# Patient Record
Sex: Female | Born: 1990 | Race: Black or African American | Hispanic: No | Marital: Married | Smoking: Former smoker
Health system: Southern US, Community
[De-identification: ages and names within clinical notes are randomized; demographics above are authoritative.]

## PROBLEM LIST (undated history)

## (undated) DIAGNOSIS — Z803 Family history of malignant neoplasm of breast: Secondary | ICD-10-CM

## (undated) DIAGNOSIS — Z800 Family history of malignant neoplasm of digestive organs: Secondary | ICD-10-CM

## (undated) DIAGNOSIS — I499 Cardiac arrhythmia, unspecified: Secondary | ICD-10-CM

## (undated) DIAGNOSIS — Z8481 Family history of carrier of genetic disease: Secondary | ICD-10-CM

## (undated) DIAGNOSIS — R51 Headache: Secondary | ICD-10-CM

## (undated) DIAGNOSIS — R220 Localized swelling, mass and lump, head: Secondary | ICD-10-CM

## (undated) DIAGNOSIS — E785 Hyperlipidemia, unspecified: Secondary | ICD-10-CM

## (undated) DIAGNOSIS — R519 Headache, unspecified: Secondary | ICD-10-CM

## (undated) DIAGNOSIS — IMO0002 Reserved for concepts with insufficient information to code with codable children: Secondary | ICD-10-CM

## (undated) DIAGNOSIS — Z8042 Family history of malignant neoplasm of prostate: Secondary | ICD-10-CM

## (undated) DIAGNOSIS — H9190 Unspecified hearing loss, unspecified ear: Secondary | ICD-10-CM

## (undated) DIAGNOSIS — F419 Anxiety disorder, unspecified: Secondary | ICD-10-CM

## (undated) DIAGNOSIS — K219 Gastro-esophageal reflux disease without esophagitis: Secondary | ICD-10-CM

## (undated) DIAGNOSIS — M1990 Unspecified osteoarthritis, unspecified site: Secondary | ICD-10-CM

## (undated) DIAGNOSIS — H04129 Dry eye syndrome of unspecified lacrimal gland: Secondary | ICD-10-CM

## (undated) DIAGNOSIS — E559 Vitamin D deficiency, unspecified: Secondary | ICD-10-CM

## (undated) DIAGNOSIS — F988 Other specified behavioral and emotional disorders with onset usually occurring in childhood and adolescence: Secondary | ICD-10-CM

## (undated) HISTORY — DX: Family history of malignant neoplasm of prostate: Z80.42

## (undated) HISTORY — PX: TENDON REPAIR: SHX5111

## (undated) HISTORY — DX: Family history of malignant neoplasm of breast: Z80.3

## (undated) HISTORY — DX: Vitamin D deficiency, unspecified: E55.9

## (undated) HISTORY — DX: Other specified behavioral and emotional disorders with onset usually occurring in childhood and adolescence: F98.8

## (undated) HISTORY — DX: Family history of carrier of genetic disease: Z84.81

## (undated) HISTORY — DX: Hyperlipidemia, unspecified: E78.5

## (undated) HISTORY — PX: ADENOIDECTOMY: SUR15

## (undated) HISTORY — DX: Family history of malignant neoplasm of digestive organs: Z80.0

---
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
## 2010-01-16 ENCOUNTER — Emergency Department (HOSPITAL_COMMUNITY): Admission: EM | Admit: 2010-01-16 | Discharge: 2010-01-16 | Payer: Self-pay | Admitting: Emergency Medicine

## 2010-04-20 ENCOUNTER — Emergency Department (HOSPITAL_COMMUNITY): Admission: EM | Admit: 2010-04-20 | Discharge: 2010-04-20 | Payer: Self-pay | Admitting: Emergency Medicine

## 2010-05-07 ENCOUNTER — Ambulatory Visit: Payer: Self-pay | Admitting: Obstetrics and Gynecology

## 2010-06-16 ENCOUNTER — Ambulatory Visit (HOSPITAL_COMMUNITY): Admission: RE | Admit: 2010-06-16 | Discharge: 2010-06-16 | Payer: Self-pay | Admitting: Family Medicine

## 2010-06-30 LAB — HEPATITIS B SURFACE ANTIGEN: Hepatitis B Surface Ag: NEGATIVE

## 2010-06-30 LAB — RPR: RPR: NONREACTIVE

## 2010-07-08 ENCOUNTER — Other Ambulatory Visit
Admission: RE | Admit: 2010-07-08 | Discharge: 2010-07-08 | Payer: Self-pay | Source: Home / Self Care | Admitting: Obstetrics and Gynecology

## 2010-10-02 LAB — CBC
HCT: 38.3 % (ref 36.0–46.0)
MCV: 99.9 fL (ref 78.0–100.0)
RBC: 3.84 MIL/uL — ABNORMAL LOW (ref 3.87–5.11)
WBC: 4.4 10*3/uL (ref 4.0–10.5)

## 2010-10-02 LAB — DIFFERENTIAL
Eosinophils Relative: 1 % (ref 0–5)
Lymphocytes Relative: 26 % (ref 12–46)
Lymphs Abs: 1.2 10*3/uL (ref 0.7–4.0)
Monocytes Absolute: 0.2 10*3/uL (ref 0.1–1.0)

## 2010-10-02 LAB — BASIC METABOLIC PANEL
BUN: 11 mg/dL (ref 6–23)
CO2: 27 mEq/L (ref 19–32)
Chloride: 107 mEq/L (ref 96–112)
Potassium: 4.2 mEq/L (ref 3.5–5.1)

## 2010-10-23 LAB — STREP A DNA PROBE: Group A Strep Probe: NEGATIVE

## 2010-10-23 LAB — POCT RAPID STREP A (OFFICE): Streptococcus, Group A Screen (Direct): NEGATIVE

## 2010-10-25 LAB — POCT URINALYSIS DIP (DEVICE)
Protein, ur: 30 mg/dL — AB
Urobilinogen, UA: 1 mg/dL (ref 0.0–1.0)

## 2010-10-25 LAB — POCT PREGNANCY, URINE: Preg Test, Ur: NEGATIVE

## 2010-10-26 LAB — POCT URINALYSIS DIP (DEVICE)
Glucose, UA: NEGATIVE mg/dL
Hgb urine dipstick: NEGATIVE
Protein, ur: NEGATIVE mg/dL
Specific Gravity, Urine: 1.025 (ref 1.005–1.030)
Urobilinogen, UA: 1 mg/dL (ref 0.0–1.0)

## 2010-10-26 LAB — GC/CHLAMYDIA PROBE AMP, GENITAL
Chlamydia, DNA Probe: POSITIVE — AB
GC Probe Amp, Genital: NEGATIVE

## 2010-10-26 LAB — WET PREP, GENITAL

## 2010-10-29 LAB — POCT URINALYSIS DIP (DEVICE)
Bilirubin Urine: NEGATIVE
Nitrite: NEGATIVE
Urobilinogen, UA: 1 mg/dL (ref 0.0–1.0)
pH: 6.5 (ref 5.0–8.0)

## 2010-10-29 LAB — WET PREP, GENITAL: Yeast Wet Prep HPF POC: NONE SEEN

## 2010-11-11 ENCOUNTER — Inpatient Hospital Stay (HOSPITAL_COMMUNITY)
Admission: AD | Admit: 2010-11-11 | Discharge: 2010-11-11 | Disposition: A | Discharge status: Home / Self Care | Payer: Medicaid Other | Source: Ambulatory Visit | Attending: Obstetrics and Gynecology | Admitting: Obstetrics and Gynecology

## 2010-11-11 DIAGNOSIS — N390 Urinary tract infection, site not specified: Secondary | ICD-10-CM | POA: Insufficient documentation

## 2010-11-11 DIAGNOSIS — O4700 False labor before 37 completed weeks of gestation, unspecified trimester: Secondary | ICD-10-CM | POA: Insufficient documentation

## 2010-11-11 DIAGNOSIS — O2390 Unspecified genitourinary tract infection in pregnancy, unspecified trimester: Secondary | ICD-10-CM

## 2010-11-11 LAB — URINE MICROSCOPIC-ADD ON

## 2010-11-11 LAB — URINALYSIS, ROUTINE W REFLEX MICROSCOPIC
Glucose, UA: NEGATIVE mg/dL
Specific Gravity, Urine: 1.015 (ref 1.005–1.030)
pH: 6 (ref 5.0–8.0)

## 2010-11-11 LAB — WET PREP, GENITAL: Clue Cells Wet Prep HPF POC: NONE SEEN

## 2010-11-13 LAB — URINE CULTURE
Colony Count: 80000
Culture  Setup Time: 201204242046

## 2011-01-30 ENCOUNTER — Encounter (HOSPITAL_COMMUNITY): Payer: Self-pay | Admitting: *Deleted

## 2011-01-30 ENCOUNTER — Inpatient Hospital Stay (HOSPITAL_COMMUNITY)
Admission: AD | Admit: 2011-01-30 | Discharge: 2011-01-30 | Disposition: A | Discharge status: Home / Self Care | Payer: Medicaid Other | Source: Ambulatory Visit | Attending: Obstetrics and Gynecology | Admitting: Obstetrics and Gynecology

## 2011-01-30 DIAGNOSIS — O479 False labor, unspecified: Secondary | ICD-10-CM | POA: Insufficient documentation

## 2011-01-30 NOTE — Progress Notes (Signed)
uc's started @ 1800 last night, spaced out during the night, now more frequent, denies bleeding or LOF.

## 2011-01-30 NOTE — Progress Notes (Signed)
?   In labor, contractions started last night, have gotten stronger.  Have been 3-10 min apart, spaced out during the night

## 2011-02-06 ENCOUNTER — Encounter (HOSPITAL_COMMUNITY): Payer: Self-pay | Admitting: *Deleted

## 2011-02-06 ENCOUNTER — Inpatient Hospital Stay (HOSPITAL_COMMUNITY): Payer: BC Managed Care – PPO

## 2011-02-06 ENCOUNTER — Inpatient Hospital Stay (HOSPITAL_COMMUNITY)
Admission: AD | Admit: 2011-02-06 | Discharge: 2011-02-10 | DRG: 370 | Disposition: A | Discharge status: Home / Self Care | Payer: BC Managed Care – PPO | Source: Ambulatory Visit | Attending: Obstetrics and Gynecology | Admitting: Obstetrics and Gynecology

## 2011-02-06 DIAGNOSIS — O339 Maternal care for disproportion, unspecified: Secondary | ICD-10-CM | POA: Diagnosis not present

## 2011-02-06 DIAGNOSIS — D696 Thrombocytopenia, unspecified: Secondary | ICD-10-CM | POA: Diagnosis present

## 2011-02-06 DIAGNOSIS — O480 Post-term pregnancy: Secondary | ICD-10-CM | POA: Diagnosis present

## 2011-02-06 DIAGNOSIS — O368190 Decreased fetal movements, unspecified trimester, not applicable or unspecified: Secondary | ICD-10-CM

## 2011-02-06 DIAGNOSIS — O9912 Other diseases of the blood and blood-forming organs and certain disorders involving the immune mechanism complicating childbirth: Secondary | ICD-10-CM | POA: Diagnosis present

## 2011-02-06 DIAGNOSIS — O330 Maternal care for disproportion due to deformity of maternal pelvic bones: Secondary | ICD-10-CM | POA: Diagnosis present

## 2011-02-06 DIAGNOSIS — D689 Coagulation defect, unspecified: Secondary | ICD-10-CM | POA: Diagnosis present

## 2011-02-06 HISTORY — DX: Reserved for concepts with insufficient information to code with codable children: IMO0002

## 2011-02-06 LAB — CBC
HCT: 38.4 % (ref 36.0–46.0)
Hemoglobin: 12.9 g/dL (ref 12.0–15.0)
MCV: 99.2 fL (ref 78.0–100.0)
WBC: 7.7 10*3/uL (ref 4.0–10.5)

## 2011-02-06 LAB — URINALYSIS, DIPSTICK ONLY
Bilirubin Urine: NEGATIVE
Glucose, UA: NEGATIVE mg/dL
Hgb urine dipstick: NEGATIVE
Ketones, ur: 15 mg/dL — AB
Specific Gravity, Urine: 1.02 (ref 1.005–1.030)
pH: 6 (ref 5.0–8.0)

## 2011-02-06 MED ORDER — LACTATED RINGERS IV SOLN
INTRAVENOUS | Status: DC
  Administered 2011-02-06 – 2011-02-07 (×4): via INTRAVENOUS

## 2011-02-06 MED ORDER — FLEET ENEMA 7-19 GM/118ML RE ENEM: 1.0000 | ENEMA | RECTAL | Status: DC | PRN

## 2011-02-06 MED ORDER — LIDOCAINE HCL (PF) 1 % IJ SOLN
30.0000 mL | INTRAMUSCULAR | Status: DC | PRN
  Filled 2011-02-06: qty 30

## 2011-02-06 MED ORDER — OXYCODONE-ACETAMINOPHEN 5-325 MG PO TABS: 2.0000 | ORAL_TABLET | ORAL | Status: DC | PRN

## 2011-02-06 MED ORDER — ONDANSETRON HCL 4 MG/2ML IJ SOLN: 4.0000 mg | Freq: Four times a day (QID) | INTRAMUSCULAR | Status: DC | PRN

## 2011-02-06 MED ORDER — NALBUPHINE SYRINGE 5 MG/0.5 ML
5.0000 mg | INJECTION | INTRAMUSCULAR | Status: DC | PRN
  Administered 2011-02-07 (×2): 5 mg via INTRAVENOUS
  Filled 2011-02-06 (×3): qty 0.5

## 2011-02-06 MED ORDER — LACTATED RINGERS IV SOLN: 500.0000 mL | INTRAVENOUS | Status: DC | PRN

## 2011-02-06 MED ORDER — IBUPROFEN 600 MG PO TABS: 600.0000 mg | ORAL_TABLET | Freq: Four times a day (QID) | ORAL | Status: DC | PRN

## 2011-02-06 MED ORDER — TERBUTALINE SULFATE 1 MG/ML IJ SOLN: 0.2500 mg | Freq: Once | INTRAMUSCULAR | Status: AC | PRN

## 2011-02-06 MED ORDER — CITRIC ACID-SODIUM CITRATE 334-500 MG/5ML PO SOLN
30.0000 mL | ORAL | Status: DC | PRN
  Administered 2011-02-07: 30 mL via ORAL
  Filled 2011-02-06: qty 30

## 2011-02-06 MED ORDER — OXYTOCIN 20 UNITS IN LACTATED RINGERS INFUSION - SIMPLE: 125.0000 mL/h | Freq: Once | INTRAVENOUS | Status: DC

## 2011-02-06 MED ORDER — ACETAMINOPHEN 325 MG PO TABS: 650.0000 mg | ORAL_TABLET | ORAL | Status: DC | PRN

## 2011-02-06 MED ORDER — PENICILLIN G POTASSIUM 5000000 UNITS IJ SOLR
5.0000 10*6.[IU] | Freq: Once | INTRAVENOUS | Status: AC | PRN
  Filled 2011-02-06: qty 5

## 2011-02-06 MED ORDER — DINOPROSTONE 10 MG VA INST
10.0000 mg | VAGINAL_INSERT | Freq: Once | VAGINAL | Status: AC
  Administered 2011-02-06: 10 mg via VAGINAL
  Filled 2011-02-06: qty 1

## 2011-02-06 MED ORDER — PENICILLIN G POTASSIUM 5000000 UNITS IJ SOLR
2.5000 10*6.[IU] | INTRAMUSCULAR | Status: DC | PRN
  Filled 2011-02-06: qty 2.5

## 2011-02-06 NOTE — ED Notes (Signed)
[facility] DC'd, pt to U/S. 

## 2011-02-06 NOTE — Progress Notes (Signed)
Pt states she has not felt the baby moving as much as usual. Called the office and sent to MAU. Has had one contractions while in the lobby. Has had some thick mucus d/c.

## 2011-02-06 NOTE — ED Notes (Signed)
Dr. Richardson Dopp coming to see pt, BPP 6/8, pt informed MD coming to see her.

## 2011-02-06 NOTE — ED Notes (Signed)
Dr. Richardson Dopp in to discuss plan of care with pt.

## 2011-02-06 NOTE — Progress Notes (Signed)
Pt states she has had decreased fetal movement all day today, some uc's, denies pain, no bleeding or LOF.  Some mucus discharge.

## 2011-02-06 NOTE — ED Provider Notes (Addendum)
History     Chief Complaint  Patient presents with  . Decreased Fetal Movement   The history is provided by the patient.   Patient of Dr. Dawayne Patricia presents with report of decreased fetal movement that began this am.  She called and spoke to Dr. Richardson Dopp and was instructed to come here for evaluation. G3 P0020.  She was due 7/16.  Denies complications with this pregnancy.    Thinks she lost her mucous plug several days ago and now just a little left.  Denies loss of fluid.  Saw Dr. Richardson Dopp 4 days ago and reports she was 1.5cm dilated.   Now having contractions 2-8 minutes apart, irregular.   Past Medical History  Diagnosis Date  . Asthma     Past Surgical History  Procedure Date  . Tonsillectomy and adenoidectomy 2010  . Adenoidectomy     No family history on file.  History  Substance Use Topics  . Smoking status: Never Smoker   . Smokeless tobacco: Not on file  . Alcohol Use: No    Allergies:  Allergies  Allergen Reactions  . Sulfa Antibiotics Hives    Prescriptions prior to admission  Medication Sig Dispense Refill  . albuterol (PROVENTIL HFA;VENTOLIN HFA) 108 (90 BASE) MCG/ACT inhaler Inhale 2 puffs into the lungs daily as needed. For asthma       . prenatal vitamin w/FE, FA (PRENATAL 1 + 1) 27-1 MG TABS Take 1 tablet by mouth daily.          ROS Physical Exam   Blood pressure 116/68, pulse 93, temperature 98.7 F (37.1 C), temperature source Oral, resp. rate 16, height 5' 4.5" (1.638 m), weight 203 lb (92.08 kg), SpO2 99.00%.  Physical Exam  MAU Course  Procedures  U/S completed MDM Consulted with Dr. Richardson Dopp.  Pt's FMS-reactive, Baseline fetal heart rate 130-135.  She is contracting q3-10 minutes.  No decels.  Cervical exam by Darel Hong, RN 1-2 Cm dilated,  70% Effaced   Darel Hong, Rn states patient reported only 5 movements since here.  Even though strip is reactive, I called Dr. Richardson Dopp to see if we could get BPP here.  She agreed, if 8/8 can be discharged home.   Dr. Richardson Dopp  called to check on patient, called her to give BPP results of 6/8.  Dr. Richardson Dopp plans to admit patient, she is coming over to put in the orders. Assessment and Plan   Decreased Fetal Movement BPP 6/8 Admit  KEY,EVE M 02/06/2011, 1:51 PM

## 2011-02-06 NOTE — H&P (Signed)
Amanda Patrick is a 20 y.o. female  At 40 wks and 4 days ega based on fist trimester u/s with EDD 02/02/2011. She presented to MAU c/o of decreased fetal movement. BPP was 6/8. She reports contractions q 2-10 min/ no vaginal bleeding. No LOF.   OB History    Grav Para Term Preterm Abortions TAB SAB Ect Mult Living   3    2 2          Past Medical History  Diagnosis Date  . Asthma     P GYN hx: chlamydia and gonorrhea treated in 2010.. H/o HPV  POB hx  EAB  X 2   Past Surgical History  Procedure Date  . Tonsillectomy and adenoidectomy 2010  . Adenoidectomy    Allergies : Sulfa / Nasonex  Family History: family history is not on file. Social History:  reports that she has never smoked. She does not have any smokeless tobacco history on file. She reports that she does not drink alcohol or use illicit drugs.   Ros negative except as stated in hpi   VS: AF VSS General A&O NAD CV RRR Lungs Clear Abd Gravid Ext trace edema bilaterally   Dilation: 1.5 Effacement (%): 70 Station: -1 Exam by:: Dorrene German RN Blood pressure 116/68, pulse 93, temperature 98.7 F (37.1 C), temperature source Oral, resp. rate 16, height 5' 4.5" (1.638 m), weight 92.08 kg (203 lb), SpO2 99.00%.  Prenatal labs: ABO, Rh:   Antibody: Negative (12/12 0000) Rubella:   RPR: Nonreactive (12/12 0000)  HBsAg: Negative (12/12 0000)  HIV: Non-reactive (12/12 0000)  GBS: Positive (06/19 0000)   Assessment/Plan: 40 4/7 wks post dates with BPP 6/8 will admit for induction Cervidil  For induction  Plan for penicillin for gbs prophylaxis in active labor or with ROM Dr. Liberty Handy covering [phone])    Amanda Patrick. 02/06/2011, 6:17 PM

## 2011-02-07 ENCOUNTER — Encounter (HOSPITAL_COMMUNITY)
Admission: AD | Disposition: A | Discharge status: Home / Self Care | Payer: Self-pay | Source: Ambulatory Visit | Attending: Obstetrics and Gynecology

## 2011-02-07 ENCOUNTER — Encounter (HOSPITAL_COMMUNITY): Payer: Self-pay | Admitting: Anesthesiology

## 2011-02-07 ENCOUNTER — Encounter (HOSPITAL_COMMUNITY): Payer: Self-pay | Admitting: *Deleted

## 2011-02-07 ENCOUNTER — Inpatient Hospital Stay (HOSPITAL_COMMUNITY): Payer: BC Managed Care – PPO | Admitting: Anesthesiology

## 2011-02-07 DIAGNOSIS — O99119 Other diseases of the blood and blood-forming organs and certain disorders involving the immune mechanism complicating pregnancy, unspecified trimester: Secondary | ICD-10-CM | POA: Diagnosis present

## 2011-02-07 DIAGNOSIS — D696 Thrombocytopenia, unspecified: Secondary | ICD-10-CM | POA: Diagnosis present

## 2011-02-07 DIAGNOSIS — O339 Maternal care for disproportion, unspecified: Secondary | ICD-10-CM | POA: Diagnosis not present

## 2011-02-07 LAB — URIC ACID: Uric Acid, Serum: 4.3 mg/dL (ref 2.4–7.0)

## 2011-02-07 LAB — COMPREHENSIVE METABOLIC PANEL
AST: 16 U/L (ref 0–37)
Albumin: 3.1 g/dL — ABNORMAL LOW (ref 3.5–5.2)
BUN: 7 mg/dL (ref 6–23)
Calcium: 9.6 mg/dL (ref 8.4–10.5)
Creatinine, Ser: 0.63 mg/dL (ref 0.50–1.10)

## 2011-02-07 LAB — CBC
HCT: 38.8 % (ref 36.0–46.0)
Hemoglobin: 13.7 g/dL (ref 12.0–15.0)
MCH: 33.9 pg (ref 26.0–34.0)
MCHC: 34.6 g/dL (ref 30.0–36.0)
MCV: 98 fL (ref 78.0–100.0)
Platelets: 116 10*3/uL — ABNORMAL LOW (ref 150–400)
RDW: 12.9 % (ref 11.5–15.5)
RDW: 12.8 % (ref 11.5–15.5)
WBC: 11.7 10*3/uL — ABNORMAL HIGH (ref 4.0–10.5)

## 2011-02-07 LAB — LACTATE DEHYDROGENASE: LDH: 152 U/L (ref 94–250)

## 2011-02-07 LAB — ABO/RH: ABO/RH(D): O POS

## 2011-02-07 SURGERY — Surgical Case
Anesthesia: Regional | Site: Abdomen | Wound class: Clean Contaminated

## 2011-02-07 MED ORDER — FENTANYL CITRATE 0.05 MG/ML IJ SOLN
INTRAMUSCULAR | Status: AC
  Filled 2011-02-07: qty 2

## 2011-02-07 MED ORDER — HYDROMORPHONE HCL 1 MG/ML IJ SOLN: 0.2500 mg | INTRAMUSCULAR | Status: DC | PRN

## 2011-02-07 MED ORDER — SODIUM CHLORIDE 0.9 % IV SOLN
1.0000 ug/kg/h | INTRAVENOUS | Status: DC | PRN
  Filled 2011-02-07: qty 2.5

## 2011-02-07 MED ORDER — DIPHENHYDRAMINE HCL 50 MG/ML IJ SOLN: 12.5000 mg | INTRAMUSCULAR | Status: DC | PRN

## 2011-02-07 MED ORDER — ONDANSETRON HCL 4 MG/2ML IJ SOLN: 4.0000 mg | Freq: Once | INTRAMUSCULAR | Status: DC | PRN

## 2011-02-07 MED ORDER — EPHEDRINE 5 MG/ML INJ
10.0000 mg | INTRAVENOUS | Status: DC | PRN
  Filled 2011-02-07: qty 4

## 2011-02-07 MED ORDER — OXYTOCIN 20 UNITS IN LACTATED RINGERS INFUSION - SIMPLE
INTRAVENOUS | Status: DC | PRN
  Administered 2011-02-07 (×2): 20 [IU] via INTRAVENOUS

## 2011-02-07 MED ORDER — ONDANSETRON HCL 4 MG/2ML IJ SOLN
INTRAMUSCULAR | Status: DC | PRN
  Administered 2011-02-07: 4 mg via INTRAVENOUS

## 2011-02-07 MED ORDER — SODIUM CHLORIDE 0.9 % IJ SOLN: 3.0000 mL | INTRAMUSCULAR | Status: DC | PRN

## 2011-02-07 MED ORDER — DIPHENHYDRAMINE HCL 25 MG PO CAPS: 25.0000 mg | ORAL_CAPSULE | ORAL | Status: DC | PRN

## 2011-02-07 MED ORDER — FENTANYL 2.5 MCG/ML BUPIVACAINE 1/10 % EPIDURAL INFUSION (WH - ANES): 14.0000 mL/h | INTRAMUSCULAR | Status: DC

## 2011-02-07 MED ORDER — EPHEDRINE 5 MG/ML INJ
INTRAVENOUS | Status: AC
  Filled 2011-02-07: qty 10

## 2011-02-07 MED ORDER — TERBUTALINE SULFATE 1 MG/ML IJ SOLN
0.2500 mg | Freq: Once | INTRAMUSCULAR | Status: AC | PRN
  Administered 2011-02-07: 0.25 mg via SUBCUTANEOUS
  Filled 2011-02-07: qty 1

## 2011-02-07 MED ORDER — MORPHINE SULFATE 0.5 MG/ML IJ SOLN
INTRAMUSCULAR | Status: AC
  Filled 2011-02-07: qty 10

## 2011-02-07 MED ORDER — SCOPOLAMINE 1 MG/3DAYS TD PT72: 1.0000 | MEDICATED_PATCH | Freq: Once | TRANSDERMAL | Status: DC

## 2011-02-07 MED ORDER — PHENYLEPHRINE HCL 10 MG/ML IJ SOLN
INTRAMUSCULAR | Status: DC | PRN
  Administered 2011-02-07 (×2): 40 ug via INTRAVENOUS
  Administered 2011-02-07: 80 ug via INTRAVENOUS

## 2011-02-07 MED ORDER — NALBUPHINE HCL 10 MG/ML IJ SOLN
5.0000 mg | INTRAMUSCULAR | Status: AC | PRN
  Filled 2011-02-07: qty 1

## 2011-02-07 MED ORDER — OXYTOCIN 10 UNIT/ML IJ SOLN
INTRAMUSCULAR | Status: AC
  Filled 2011-02-07: qty 2

## 2011-02-07 MED ORDER — OXYTOCIN 20 UNITS IN LACTATED RINGERS INFUSION - SIMPLE
1.0000 m[IU]/min | INTRAVENOUS | Status: DC
  Administered 2011-02-07: 1 m[IU]/min via INTRAVENOUS
  Filled 2011-02-07: qty 1000

## 2011-02-07 MED ORDER — MORPHINE SULFATE (PF) 0.5 MG/ML IJ SOLN
INTRAMUSCULAR | Status: DC | PRN
  Administered 2011-02-07: .1 mg via INTRATHECAL

## 2011-02-07 MED ORDER — KETOROLAC TROMETHAMINE 60 MG/2ML IM SOLN
INTRAMUSCULAR | Status: AC
  Filled 2011-02-07: qty 2

## 2011-02-07 MED ORDER — IBUPROFEN 200 MG PO TABS: 200.0000 mg | ORAL_TABLET | Freq: Four times a day (QID) | ORAL | Status: DC | PRN

## 2011-02-07 MED ORDER — FENTANYL CITRATE 0.05 MG/ML IJ SOLN
INTRAMUSCULAR | Status: DC | PRN
  Administered 2011-02-07: 20 ug via INTRATHECAL

## 2011-02-07 MED ORDER — PHENYLEPHRINE 40 MCG/ML (10ML) SYRINGE FOR IV PUSH (FOR BLOOD PRESSURE SUPPORT)
80.0000 ug | PREFILLED_SYRINGE | INTRAVENOUS | Status: DC | PRN
  Filled 2011-02-07: qty 5

## 2011-02-07 MED ORDER — KETOROLAC TROMETHAMINE 30 MG/ML IJ SOLN: 15.0000 mg | Freq: Once | INTRAMUSCULAR | Status: DC | PRN

## 2011-02-07 MED ORDER — EPHEDRINE SULFATE 50 MG/ML IJ SOLN
INTRAMUSCULAR | Status: DC | PRN
  Administered 2011-02-07 (×2): 10 mg via INTRAVENOUS

## 2011-02-07 MED ORDER — KETOROLAC TROMETHAMINE 60 MG/2ML IM SOLN
60.0000 mg | Freq: Once | INTRAMUSCULAR | Status: AC | PRN
  Administered 2011-02-07: 60 mg via INTRAMUSCULAR

## 2011-02-07 MED ORDER — ONDANSETRON HCL 4 MG/2ML IJ SOLN
INTRAMUSCULAR | Status: AC
  Filled 2011-02-07: qty 2

## 2011-02-07 MED ORDER — NALOXONE HCL 0.4 MG/ML IJ SOLN: 0.4000 mg | INTRAMUSCULAR | Status: DC | PRN

## 2011-02-07 MED ORDER — CEFAZOLIN SODIUM-DEXTROSE 2-3 GM-% IV SOLR
2.0000 g | Freq: Once | INTRAVENOUS | Status: AC
  Administered 2011-02-07: 2 g via INTRAVENOUS
  Filled 2011-02-07: qty 50

## 2011-02-07 MED ORDER — IBUPROFEN 600 MG PO TABS
600.0000 mg | ORAL_TABLET | Freq: Four times a day (QID) | ORAL | Status: DC | PRN
  Filled 2011-02-07 (×8): qty 1

## 2011-02-07 MED ORDER — DIPHENHYDRAMINE HCL 50 MG/ML IJ SOLN: 25.0000 mg | INTRAMUSCULAR | Status: DC | PRN

## 2011-02-07 MED ORDER — MEPERIDINE HCL 25 MG/ML IJ SOLN: 6.2500 mg | INTRAMUSCULAR | Status: DC | PRN

## 2011-02-07 MED ORDER — PHENYLEPHRINE HCL 10 MG/ML IJ SOLN
INTRAMUSCULAR | Status: AC
  Filled 2011-02-07: qty 1

## 2011-02-07 MED ORDER — LACTATED RINGERS IV SOLN: 500.0000 mL | Freq: Once | INTRAVENOUS | Status: DC

## 2011-02-07 MED ORDER — AMPICILLIN SODIUM 2 G IJ SOLR
2.0000 g | Freq: Four times a day (QID) | INTRAMUSCULAR | Status: DC
  Administered 2011-02-07: 2 g via INTRAVENOUS
  Filled 2011-02-07 (×5): qty 2000

## 2011-02-07 MED ORDER — ONDANSETRON HCL 4 MG/2ML IJ SOLN: 4.0000 mg | Freq: Three times a day (TID) | INTRAMUSCULAR | Status: DC | PRN

## 2011-02-07 SURGICAL SUPPLY — 40 items
BENZOIN TINCTURE PRP APPL 2/3 (GAUZE/BANDAGES/DRESSINGS) ×2 IMPLANT
CLOTH BEACON ORANGE TIMEOUT ST (SAFETY) ×2 IMPLANT
CONTAINER PREFILL 10% NBF 15ML (MISCELLANEOUS) IMPLANT
DRAPE UTILITY XL STRL (DRAPES) IMPLANT
DRESSING TELFA 8X3 (GAUZE/BANDAGES/DRESSINGS) ×2 IMPLANT
ELECT REM PT RETURN 9FT ADLT (ELECTROSURGICAL) ×2
ELECTRODE REM PT RTRN 9FT ADLT (ELECTROSURGICAL) ×1 IMPLANT
EXTRACTOR VACUUM M CUP 4 TUBE (SUCTIONS) IMPLANT
GAUZE SPONGE 4X4 12PLY STRL LF (GAUZE/BANDAGES/DRESSINGS) ×2 IMPLANT
GLOVE BIOGEL M 6.5 STRL (GLOVE) ×4 IMPLANT
GLOVE BIOGEL PI IND STRL 6.5 (GLOVE) ×2 IMPLANT
GLOVE BIOGEL PI INDICATOR 6.5 (GLOVE) ×2
GOWN BRE IMP SLV AUR LG STRL (GOWN DISPOSABLE) ×2 IMPLANT
GOWN BRE IMP SLV AUR XL STRL (GOWN DISPOSABLE) ×4 IMPLANT
KIT ABG SYR 3ML LUER SLIP (SYRINGE) IMPLANT
NEEDLE HYPO 25X5/8 SAFETYGLIDE (NEEDLE) IMPLANT
NS IRRIG 1000ML POUR BTL (IV SOLUTION) ×2 IMPLANT
PACK C SECTION WH (CUSTOM PROCEDURE TRAY) ×2 IMPLANT
PAD ABD 7.5X8 STRL (GAUZE/BANDAGES/DRESSINGS) IMPLANT
RTRCTR C-SECT PINK 25CM LRG (MISCELLANEOUS) IMPLANT
RTRCTR C-SECT PINK 34CM XLRG (MISCELLANEOUS) IMPLANT
SLEEVE SCD COMPRESS KNEE MED (MISCELLANEOUS) ×2 IMPLANT
STAPLER VISISTAT 35W (STAPLE) IMPLANT
STRIP CLOSURE SKIN 1/2X4 (GAUZE/BANDAGES/DRESSINGS) ×2 IMPLANT
SUT CHROMIC 1 CTX 36 (SUTURE) ×4 IMPLANT
SUT MON AB 4-0 PS1 27 (SUTURE) ×2 IMPLANT
SUT PDS AB 0 CT1 27 (SUTURE) IMPLANT
SUT PDS AB 1 CT  36 (SUTURE) ×1
SUT PDS AB 1 CT 36 (SUTURE) ×1 IMPLANT
SUT PLAIN 0 NONE (SUTURE) IMPLANT
SUT VIC AB 0 CTX 36 (SUTURE)
SUT VIC AB 0 CTX36XBRD ANBCTRL (SUTURE) IMPLANT
SUT VIC AB 2-0 CT1 27 (SUTURE) ×1
SUT VIC AB 2-0 CT1 TAPERPNT 27 (SUTURE) ×1 IMPLANT
SUT VIC AB 3-0 SH 27 (SUTURE)
SUT VIC AB 3-0 SH 27X BRD (SUTURE) IMPLANT
SUT VIC AB 4-0 KS 27 (SUTURE) IMPLANT
TOWEL OR 17X24 6PK STRL BLUE (TOWEL DISPOSABLE) ×2 IMPLANT
TRAY FOLEY CATH 14FR (SET/KITS/TRAYS/PACK) ×2 IMPLANT
WATER STERILE IRR 1000ML POUR (IV SOLUTION) ×2 IMPLANT

## 2011-02-07 NOTE — Progress Notes (Addendum)
Amanda Patrick is a 20 y.o. G3P0020 at [redacted]w[redacted]d admitted for induction for BPP 6/8.  Subjective: She reports pain 5/10 with contractions.  No lof/vb and no other complaints.  Objective: BP 117/60  Pulse 88  Temp(Src) 98.2 F (36.8 C) (Tympanic)  Resp 20  Ht 5' 4.5" (1.638 m)  Wt 92.08 kg (203 lb)  BMI 34.31 kg/m2  SpO2 99%      FHT:  FHR: 140's bpm, variability: moderate,  accelerations:  Present,  decelerations:  Absent UC:   irregular, every 1 to 5 minutes SVE:   Dilation: 2 Effacement (%): 70 Station: -2 Exam by::  (Dr Amanda Patrick)  Labs: Lab Results  Component Value Date   WBC 7.7 02/06/2011   HGB 12.9 02/06/2011   HCT 38.4 02/06/2011   MCV 99.2 02/06/2011   PLT 123* 02/06/2011    Assessment / Plan: Induction of labor due to non-reassuring fetal testing,  progressing well on pitocin  Labor:  Foley bulb placed, and pitocin started for augmentation after cervidil removed at 0800.  Progressing normally Preeclampsia:  No evidence of preeclampsia. BP only slightly elevated on admission with SDP 140's, normal DBP.  UA on admission was negative for protein.  CBC on admission shows platelet count of 123K.  Will recheck now. no signs or symptoms of toxicity Fetal Wellbeing:  Category I Pain Control:  Labor support without medications I/D:  n/a Anticipated MOD:  NSVD  Amanda Patrick 02/07/2011, 9:01 AM  Will check CBC, CMP, uric acid and LDH due to new low platelet count on admission.

## 2011-02-07 NOTE — Op Note (Addendum)
02/07/2011  6:59 PM  PATIENT:  Amanda Patrick  20 y.o. female  PRE-OPERATIVE DIAGNOSIS:  1) IUP at 40+5 weeks, 2) Non reassuring antenatal testing with BPP 6/8, 3) Cephalopelvic dispoportion  POST-OPERATIVE DIAGNOSIS:  1) IUP at 40+5 weeks, 2) Non reassuring antenatal testing with BPP 6/8, 3)  Cephalopelvic dispoportion  PROCEDURE:  Procedure(s): CESAREAN SECTION  SURGEON:  Liberty Handy MD  ASSISTANTS: Arlyce Harman MD   ANESTHESIA:   spinal  ESTIMATED BLOOD LOSS: 800 ml  BLOOD ADMINISTERED:none  DRAINS: Urinary Catheter (Foley)   LOCAL MEDICATIONS USED:  NONE  SPECIMEN:  No Specimen  DISPOSITION OF SPECIMEN:  N/A  COUNTS:  YES  TOURNIQUET:  * No tourniquets in log *  DICTATION #:  [id number]  PLAN OF CARE: Transfer to mother baby unit  PATIENT DISPOSITION:  PACU - hemodynamically stable.   Delay start of Pharmacological VTE agent (>24hrs) due to surgical blood loss or risk of bleeding:  not applicable

## 2011-02-07 NOTE — Consult Note (Signed)
Delivery Note   02/07/2011  6:35 PM  Requested by Dr.   Shearon Stalls to attend this C-section for  FTP. Born to a  20  y/o G3P0 mother with [facility]  and negative screens except (+) GBS status.     AROM  5 hours PTD with clear fluid.  MOB received a dose of Ampicillin less than 2 hours PTD.     The c/section delivery was uncomplicated otherwise.  Infant handed to Neo crying.  Dried, bulb suctioned and kept warm.  APGAR 9 and 9.  Care transfer to Peds. Teaching service.    Chales Abrahams V.T. Kasai Beltran, MD Neonatologist

## 2011-02-07 NOTE — Progress Notes (Signed)
Amanda Patrick is a 20 y.o. G3P0020 at [redacted]w[redacted]d admitted for IOL for BPP 6/8.  Labs:  Results for orders placed during the hospital encounter of 02/06/11 (from the past 24 hour(s))  CBC     Status: Abnormal   Collection Time   02/06/11  7:37 PM      Component Value Range   WBC 7.7  4.0 - 10.5 (K/uL)   RBC 3.87  3.87 - 5.11 (MIL/uL)   Hemoglobin 12.9  12.0 - 15.0 (g/dL)   HCT 04.5  40.9 - 81.1 (%)   MCV 99.2  78.0 - 100.0 (fL)   MCH 33.3  26.0 - 34.0 (pg)   MCHC 33.6  30.0 - 36.0 (g/dL)   RDW 91.4  78.2 - 95.6 (%)   Platelets 123 (*) 150 - 400 (K/uL)  RPR     Status: Normal   Collection Time   02/06/11  7:37 PM      Component Value Range   RPR NON REACTIVE  NON REACTIVE   URINALYSIS, DIPSTICK ONLY     Status: Abnormal   Collection Time   02/06/11 11:05 PM      Component Value Range   Specific Gravity, Urine 1.020  1.005 - 1.030    pH 6.0  5.0 - 8.0    Glucose, UA NEGATIVE  NEGATIVE (mg/dL)   Hgb urine dipstick NEGATIVE  NEGATIVE    Bilirubin Urine NEGATIVE  NEGATIVE    Ketones, ur 15 (*) NEGATIVE (mg/dL)   Protein, ur NEGATIVE  NEGATIVE (mg/dL)   Urobilinogen, UA 0.2  0.0 - 1.0 (mg/dL)   Nitrite NEGATIVE  NEGATIVE    Leukocytes, UA NEGATIVE  NEGATIVE   CBC     Status: Abnormal   Collection Time   02/07/11  9:53 AM      Component Value Range   WBC 8.6  4.0 - 10.5 (K/uL)   RBC 3.98  3.87 - 5.11 (MIL/uL)   Hemoglobin 13.5  12.0 - 15.0 (g/dL)   HCT 21.3  08.6 - 57.8 (%)   MCV 98.0  78.0 - 100.0 (fL)   MCH 33.9  26.0 - 34.0 (pg)   MCHC 34.6  30.0 - 36.0 (g/dL)   RDW 46.9  62.9 - 52.8 (%)   Platelets 116 (*) 150 - 400 (K/uL)  COMPREHENSIVE METABOLIC PANEL     Status: Abnormal   Collection Time   02/07/11  9:53 AM      Component Value Range   Sodium 132 (*) 135 - 145 (mEq/L)   Potassium 3.6  3.5 - 5.1 (mEq/L)   Chloride 98  96 - 112 (mEq/L)   CO2 23  19 - 32 (mEq/L)   Glucose, Bld 74  70 - 99 (mg/dL)   BUN 7  6 - 23 (mg/dL)   Creatinine, Ser 4.13  0.50 - 1.10 (mg/dL)   Calcium 9.6  8.4 - 24.4 (mg/dL)   Total Protein 7.1  6.0 - 8.3 (g/dL)   Albumin 3.1 (*) 3.5 - 5.2 (g/dL)   AST 16  0 - 37 (U/L)   ALT 10  0 - 35 (U/L)   Alkaline Phosphatase 160 (*) 39 - 117 (U/L)   Total Bilirubin 0.4  0.3 - 1.2 (mg/dL)   GFR calc non Af Amer >60  >60 (mL/min)   GFR calc Af Amer >60  >60 (mL/min)  URIC ACID     Status: Normal   Collection Time   02/07/11  9:53 AM      Component  Value Range   Uric Acid, Serum 4.3  2.4 - 7.0 (mg/dL)  LACTATE DEHYDROGENASE     Status: Normal   Collection Time   02/07/11  9:53 AM      Component Value Range   LD 152  94 - 250 (U/L)    Assessment / Plan: 1) Fetal status reassuring 2) Labor: Foley bulb still in place.  Continue low dose pitocin. 3) Thrombocytopenia:  Platelet count decreased slightly from 123K to 116K.  No other abnormal labs, BP's normal.  Will continue to check CBC q4 hours.  Malcolm Metro 02/07/2011, 11:37 AM

## 2011-02-07 NOTE — Progress Notes (Addendum)
Quyen Cutsforth is a 20 y.o. G3P0020 at [redacted]w[redacted]d admitted for IOL for BPP 6/8.  Subjective: Pt doing well.  She ranks her pain at 5-6/10 and gradually worsening with contractions.  No desire for IV pain medications or CLE yet.  No other complaints.  Objective: BP 100/49  Pulse 88  Temp(Src) 98.2 F (36.8 C) (Tympanic)  Resp 20  Ht 5' 4.5" (1.638 m)  Wt 92.08 kg (203 lb)  BMI 34.31 kg/m2  SpO2 99%      FHT:  FHR: 130's bpm, variability: moderate,  accelerations:  Present,  decelerations:  Absent UC:   regular, every 1-3 minutes SVE:  Foley bulb in the vagina --> removed.  Dilation: 5 Effacement (%): 70 Station: -2 Exam by:: Dr Shearon Stalls AROM with clear fluid noted.  IUPC placed.  Labs: Lab Results  Component Value Date   WBC 8.6 02/07/2011   HGB 13.5 02/07/2011   HCT 39.0 02/07/2011   MCV 98.0 02/07/2011   PLT 116* 02/07/2011    Assessment / Plan: Induction of labor due to non-reassuring fetal testing,  progressing well on pitocin.   Thrombocytopenia:  Will monitor with CBC q4hrs.  All other labs normal.  No evidence of acute bleeding.  H/H stable.  Labor: Progressing normally Preeclampsia:  no signs or symptoms of toxicity Fetal Wellbeing:  Category I Pain Control:  Labor support without medications Anticipated MOD:  NSVD  Malcolm Metro 02/07/2011, 1:40 PM  Addendum:   RN incidentally just noted that the patient's pitocin was never started.  She progressed with foley bulb induction alone to her current cervical exam.  Will now initiate pitocin for induction.  Nubain IV for pain as needed.  No CLE desired at this time.

## 2011-02-07 NOTE — Anesthesia Postprocedure Evaluation (Signed)
Vital signs stable Patient alert Pain and nausea are controlled No apparent anesthetic complications No follow up care needed Pt may be d/c when nm fxn returns 

## 2011-02-07 NOTE — Progress Notes (Signed)
Dr. Arby Barrette aware of pts PLT count.  MD states that pt may have Toradol 60mg  IM at this time.

## 2011-02-07 NOTE — Anesthesia Procedure Notes (Addendum)
Spinal Block  Patient location during procedure: OR Start time: 02/07/2011 6:05 PM End time: 02/07/2011 6:10 PM Staffing Anesthesiologist: Sandrea Hughs Performed by: anesthesiologist  Preanesthetic Checklist Completed: patient identified, site marked, surgical consent, pre-op evaluation, timeout performed, IV checked, risks and benefits discussed and monitors and equipment checked Spinal Block Patient position: sitting Prep: DuraPrep Patient monitoring: heart rate, cardiac monitor, continuous pulse ox and blood pressure Approach: midline Location: L3-4 Injection technique: single-shot Needle Needle type: Sprotte  Needle gauge: 24 G Needle length: 9 cm Needle insertion depth: 6 cm Assessment Sensory level: T4 Events: paresthesia Additional Notes Paresthesia was R leg x 2 with + CSF. I used 12mg  of Bupivicaine, 20 mcg Fentanyl, and 0.1mg  of Duramorph

## 2011-02-07 NOTE — Progress Notes (Signed)
Amanda Patrick is a 20 y.o. G3P0020 at [redacted]w[redacted]d by admitted for [facility] 6/8.  Subjective: Pt reports very strong contractions, ranked 8-10/10.  I was called to evaluate patient because she was having an uncontrollable urge to push.  No other complaints.  Pt has gotten two doses of nubain with minimal relief in her pain.  Declines CLE.  Objective: BP 129/90  Pulse 86  Temp(Src) 97.9 F (36.6 C) (Axillary)  Resp 20  Ht 5' 4.5" (1.638 m)  Wt 92.08 kg (203 lb)  BMI 34.31 kg/m2  SpO2 99%      FHT:  FHR: 130's bpm, variability: moderate,  accelerations:  Present,  decelerations:  Present early decels with contractions UC:   regular, every 2 minutes, MVU's adequate SVE:   Dilation: 6.5 Effacement (%): 100 Station: 0 Exam by:: Dr Shearon Stalls  Labs: Lab Results  Component Value Date   WBC 11.7* 02/07/2011   HGB 13.7 02/07/2011   HCT 38.8 02/07/2011   MCV 97.7 02/07/2011   PLT 123* 02/07/2011    Assessment / Plan: Arrest in active phase of labor  Counseling note: Pt has labored with no change in dilation or descent in the last hour.  She also has had no descent of the fetal head over the last 6 hours with a very narrow pelvic outlet and fetal caput noted.  I counseled the patient that she had the option to continue laboring for another hour with reevaluation vs. the option to proceed with a primary C/S due to cephalopelvic disproportion and failed induction.  I reviewed that her cervix had changed slightly in dilation since my last exam at 1400, but the fetal head had not descended at all, and it is pushed tightly against her pelvic sidewall.  All R/B/I/A of cesarean section reviewed to include risks of infection, bleeding, damage to surrounding structures and organs, transfusion and hysterectomy.  She desires to proceed with primary cesarean section.  The fetal status remains over all reassuring with intermittent early decelerations and excellent BTBV.  All questions answered.  Labor: Failed  induction with CPD Fetal Wellbeing:  Category II Pain Control:  Labor support without medications and nubain I/D:  n/a Anticipated MOD:  primary cesarean section  Malcolm Metro 02/07/2011, 4:33 PM

## 2011-02-07 NOTE — Transfer of Care (Signed)
Immediate Anesthesia Transfer of Care Note  Patient: Amanda Patrick  Procedure(s) Performed:  CESAREAN SECTION - Primary cesarean section with delivery of baby boy at 21.Apgars 9/9.  Patient Location: PACU  Anesthesia Type: Spinal  Level of Consciousness: awake, alert , oriented and patient cooperative  Airway & Oxygen Therapy: Patient Spontanous Breathing  Post-op Assessment: Report given to PACU RN  Post vital signs: Reviewed and stable  Complications: No apparent anesthesia complications

## 2011-02-07 NOTE — Anesthesia Preprocedure Evaluation (Addendum)
Anesthesia Evaluation  Name, MR# and DOB Patient awake  General Assessment Comment  Reviewed: Allergy & Precautions, H&P  and Patient's Chart, lab work & pertinent test results  Airway Mallampati: II TM Distance: >3 FB Neck ROM: full    Dental No notable dental hx    Pulmonaryneg pulmonary ROS      pulmonary exam normal   Cardiovascular regular Normal   Neuro/PsychNegative Neurological ROS Negative Psych ROS  GI/Hepatic/Renal negative GI ROS, negative Liver ROS, and negative Renal ROS (+)       Endo/Other  Negative Endocrine ROS (+)   Abdominal   Musculoskeletal  Hematology negative hematology ROS (+)   Peds  Reproductive/Obstetrics (+) Pregnancy   Anesthesia Other Findings             Anesthesia Physical Anesthesia Plan  ASA: II  Anesthesia Plan: Spinal   Post-op Pain Management:    Induction:   Airway Management Planned:   Additional Equipment:   Intra-op Plan:   Post-operative Plan:   Informed Consent: I have reviewed the patients History and Physical, chart, labs and discussed the procedure including the risks, benefits and alternatives for the proposed anesthesia with the patient or authorized representative who has indicated his/her understanding and acceptance.     Plan Discussed with:   Anesthesia Plan Comments:         Anesthesia Quick Evaluation

## 2011-02-08 LAB — CBC
HCT: 30.7 % — ABNORMAL LOW (ref 36.0–46.0)
Hemoglobin: 10.4 g/dL — ABNORMAL LOW (ref 12.0–15.0)
MCH: 33.7 pg (ref 26.0–34.0)
MCH: 33.8 pg (ref 26.0–34.0)
MCHC: 34.5 g/dL (ref 30.0–36.0)
MCHC: 34.2 g/dL (ref 30.0–36.0)
MCV: 97.5 fL (ref 78.0–100.0)
MCV: 100 fL (ref 78.0–100.0)
Platelets: 108 10*3/uL — ABNORMAL LOW (ref 150–400)
Platelets: 120 10*3/uL — ABNORMAL LOW (ref 150–400)
RBC: 3.02 MIL/uL — ABNORMAL LOW (ref 3.87–5.11)
RDW: 12.9 % (ref 11.5–15.5)
RDW: 13 % (ref 11.5–15.5)
RDW: 13.1 % (ref 11.5–15.5)
WBC: 12 10*3/uL — ABNORMAL HIGH (ref 4.0–10.5)
WBC: 9.3 10*3/uL (ref 4.0–10.5)

## 2011-02-08 MED ORDER — ZOLPIDEM TARTRATE 5 MG PO TABS: 5.0000 mg | ORAL_TABLET | Freq: Every evening | ORAL | Status: DC | PRN

## 2011-02-08 MED ORDER — SODIUM CHLORIDE 0.9 % IV SOLN
2.0000 g | Freq: Four times a day (QID) | INTRAVENOUS | Status: DC
  Administered 2011-02-08 – 2011-02-09 (×6): 2 g via INTRAVENOUS
  Filled 2011-02-08 (×7): qty 2000

## 2011-02-08 MED ORDER — TETANUS-DIPHTH-ACELL PERTUSSIS 5-2.5-18.5 LF-MCG/0.5 IM SUSP: 0.5000 mL | Freq: Once | INTRAMUSCULAR | Status: DC

## 2011-02-08 MED ORDER — SODIUM CHLORIDE 0.9 % IR SOLN
Status: DC | PRN
  Administered 2011-02-08: 1000 mL

## 2011-02-08 MED ORDER — ONDANSETRON HCL 4 MG/2ML IJ SOLN: 4.0000 mg | INTRAMUSCULAR | Status: DC | PRN

## 2011-02-08 MED ORDER — SIMETHICONE 80 MG PO CHEW
80.0000 mg | CHEWABLE_TABLET | Freq: Three times a day (TID) | ORAL | Status: DC
  Administered 2011-02-08 – 2011-02-09 (×7): 80 mg via ORAL

## 2011-02-08 MED ORDER — OXYCODONE-ACETAMINOPHEN 5-325 MG PO TABS
1.0000 | ORAL_TABLET | ORAL | Status: DC | PRN
  Administered 2011-02-08 – 2011-02-09 (×7): 1 via ORAL
  Administered 2011-02-09 – 2011-02-10 (×2): 2 via ORAL
  Filled 2011-02-08 (×3): qty 1
  Filled 2011-02-08: qty 2
  Filled 2011-02-08: qty 1
  Filled 2011-02-08: qty 2
  Filled 2011-02-08 (×3): qty 1

## 2011-02-08 MED ORDER — IBUPROFEN 600 MG PO TABS
600.0000 mg | ORAL_TABLET | Freq: Four times a day (QID) | ORAL | Status: DC
  Administered 2011-02-08 – 2011-02-10 (×9): 600 mg via ORAL
  Filled 2011-02-08: qty 1

## 2011-02-08 MED ORDER — SIMETHICONE 80 MG PO CHEW: 80.0000 mg | CHEWABLE_TABLET | ORAL | Status: DC | PRN

## 2011-02-08 MED ORDER — ONDANSETRON HCL 4 MG PO TABS: 4.0000 mg | ORAL_TABLET | ORAL | Status: DC | PRN

## 2011-02-08 MED ORDER — PRENATAL PLUS 27-1 MG PO TABS
1.0000 | ORAL_TABLET | Freq: Every day | ORAL | Status: DC
  Administered 2011-02-08 – 2011-02-09 (×2): 1 via ORAL
  Filled 2011-02-08 (×2): qty 1

## 2011-02-08 MED ORDER — DIPHENHYDRAMINE HCL 25 MG PO CAPS: 25.0000 mg | ORAL_CAPSULE | Freq: Four times a day (QID) | ORAL | Status: DC | PRN

## 2011-02-08 MED ORDER — OXYTOCIN 20 UNITS IN LACTATED RINGERS INFUSION - SIMPLE: 125.0000 mL/h | INTRAVENOUS | Status: AC

## 2011-02-08 MED ORDER — SENNOSIDES-DOCUSATE SODIUM 8.6-50 MG PO TABS
1.0000 | ORAL_TABLET | Freq: Every day | ORAL | Status: DC
  Administered 2011-02-08 – 2011-02-09 (×2): 1 via ORAL

## 2011-02-08 MED ORDER — MENTHOL 3 MG MT LOZG: 1.0000 | LOZENGE | OROMUCOSAL | Status: DC | PRN

## 2011-02-08 MED ORDER — WITCH HAZEL-GLYCERIN EX PADS: MEDICATED_PAD | CUTANEOUS | Status: DC | PRN

## 2011-02-08 NOTE — Progress Notes (Signed)
Subjective: Postpartum Day 1: Cesarean Delivery Patient reports incisional pain, tolerating PO, + flatus and no problems voiding.    Objective: Vital signs in last 24 hours: Temp:  [97.4 F (36.3 C)-98.5 F (36.9 C)] 98.4 F (36.9 C) (07/22 1145) Pulse Rate:  [72-114] 93  (07/22 1145) Resp:  [16-24] 18  (07/22 1145) BP: (100-140)/(39-94) 117/67 mmHg (07/22 1145) SpO2:  [97 %-100 %] 97 % (07/22 1145)  Physical Exam:  General: alert and cooperative Lochia: appropriate Uterine Fundus: firm at U Incision: bandage C/D/I DVT Evaluation: No evidence of DVT seen on physical exam.   Basename 02/08/11 1100 02/08/11 0524  HGB 10.4* 10.6*  HCT 30.4* 30.7*  Platelets     106K (02/08/11 1100), decreased from 108K  Assessment/Plan: Status post Cesarean section. Doing well postoperatively.  Continue current care.  May remove bandage tonight 24hrs postpartum Thrombocytopenia:  Will continue to monitor with repeat CBC q6 hrs until improving trend.  Pt clinically stable without acute bleeding.  Liberty Handy MILSAW 02/08/2011, 1:03 PM

## 2011-02-08 NOTE — Progress Notes (Signed)
Baby now 35 hours old, has had some 5 minute breastfeeding sessions, but very fussy at breast and unable to maintain latch. Started on double photo therapy this am, + DAT,  Bil breast nipples inverted, right more than left, pre-pumping with hand pump will bring nipples out slightly, but baby not able to obtain latch with several attempts. Initiated #24 nipple shield, baby able to latch after several attempts and nursed off and on for 15 minutes. No colostrum visible in nipple shield, used nipple shield to latch onto left breast, baby able to latch after several attempts and nursed off and on for 10 minutes. No colostrum visible. Set up DEBP and had mom post pump. Enc mom to wear shells. Plan at this time is to attempt to nurse baby every 2-3 hours or when parents see feeding ques using nipple shield, try to keep baby actively nursing for 15 minutes each breast. Post pump for 15 minutes to enc milk and give baby back any colostrum from pumping with medicine dropper. Wear shells.

## 2011-02-08 NOTE — OR Nursing (Signed)
Chart checked and late entries put in to complete charting.

## 2011-02-08 NOTE — Progress Notes (Signed)
Follow-up visit for next feeding, baby latched on right breast after several attempts using #24 nipple shield, has disorganized suck and thrusts tongue but did nurse 15-20 minutes in football hold with LC assist.  Latched to left breast with #24 nipple shield after several attemps for approx 7 min. No colostrum visible in nipple shield from either breast.  Mom post-pumped, did not get any colostrum with pumping. Discussed supplementation due to photo therapy, parents agreed. Demonstrated giving formula with medicine dropper and used slow flow nipple. Enc using slow flow nipple for suck training and reviewed how to do this. Baby took approx 8 ml. Enc to supplement with  EBM or formula or combination of both to equal 10-15 ml after each breastfeeding. Continue to offer breast every 3 hours or when baby gives feeding ques. LC to follow up tomorrow to reassess feeding progress.

## 2011-02-08 NOTE — OR Nursing (Signed)
Uterus massaged by Dr. Shearon Stalls. Cord blood x 2 to lab.

## 2011-02-09 NOTE — Progress Notes (Signed)
UR chart review completed.  

## 2011-02-09 NOTE — Progress Notes (Signed)
Post Partum Day 2 s/p LTCS Subjective: up ad lib, voiding, tolerating PO and + flatus  Objective: Blood pressure 112/63, pulse 91, temperature 98.2 F (36.8 C), temperature source Oral, resp. rate 20, height 5' 4.5" (1.638 m), weight 92.08 kg (203 lb), SpO2 99.00%, unknown if currently breastfeeding.  Physical Exam:  General: alert and cooperative Lochia: appropriate Uterine Fundus: firm Incision: healing well, no significant drainage DVT Evaluation: No evidence of DVT seen on physical exam.   Basename 02/08/11 1707 02/08/11 1100  HGB 10.3* 10.4*  HCT 30.2* 30.4*    Assessment/Plan: Plan for discharge tomorrow and Breastfeeding D/C antibiotic    LOS: 3 days   Sharion Grieves J. 02/09/2011, 10:19 AM

## 2011-02-10 MED ORDER — OXYCODONE-ACETAMINOPHEN 5-325 MG PO TABS: 1.0000 | ORAL_TABLET | Freq: Once | ORAL | Status: DC

## 2011-02-10 MED ORDER — IBUPROFEN 600 MG PO TABS: 600.0000 mg | ORAL_TABLET | Freq: Four times a day (QID) | ORAL | Status: AC | PRN

## 2011-02-10 MED ORDER — OXYCODONE-ACETAMINOPHEN 5-325 MG PO TABS: 1.0000 | ORAL_TABLET | Freq: Four times a day (QID) | ORAL | Status: AC | PRN

## 2011-02-10 NOTE — Op Note (Signed)
NAMEJUDE, LINCK           ACCOUNT NO.:  [id number]  MEDICAL RECORD NO.:  [id number]  LOCATION:  9145                          FACILITY:  [facility]  PHYSICIAN:  Liberty Handy, MD  DATE OF BIRTH:  02/20/1991  DATE OF PROCEDURE:  02/07/2011 DATE OF DISCHARGE:                              OPERATIVE REPORT   PREOPERATIVE DIAGNOSES: 1. Intrauterine pregnancy at 40 weeks and 5 days, estimated     gestational age. 2. Induction of labor for nonreassuring antenatal testing, BPP of 6/8. 3. Cephalopelvic disproportion. 4. Failed induction. 5. Thrombocytopenia.  POSTOPERATIVE DIAGNOSES: 1. Intrauterine pregnancy at 40 weeks and 5 days, estimated     gestational age. 2. Induction of labor for nonreassuring antenatal testing, BPP of 6/8. 3. Cephalopelvic disproportion. 4. Failed induction. 5. Thrombocytopenia.  PROCEDURE:  Primary low-transverse cesarean delivery.  SURGEON:  Liberty Handy, MD  ASSISTANT:  Arlyce Harman, MD  ANESTHESIA:  Spinal.  COMPLICATIONS:  None.  DRAINS:  Foley to gravity.  ESTIMATED BLOOD LOSS:  800 mL.  IV FLUIDS:  2300 mL.  URINE OUTPUT:  450 mL.  SPECIMENS:  None.  INDICATIONS:  The patient is a 20 year old gravida 3, para 1-0-2-1 female who presented for decreased fetal movement noted to have a nonreassuring BPP of 6/8.  She was admitted for induction of labor.  She progressed in labor to 6-7 cm, complete effacement, -2 station with no cervical change over approximately 2 hours despite adequate contractions.  The patient had a very narrow, android pelvis.  She was counseled on all the risks, benefits, indications, and alternatives of a cesarean delivery and she agreed to proceed.  FINDINGS: 1. Normal uterus, fallopian tubes, and ovaries bilaterally. 2. Delivered a female infant weighing 3155 grams with Apgars of 9 and 9.  PROCEDURE:  The patient was taken to the operating room where spinal anesthesia was found to be adequate.  The  patient was prepared and draped in normal sterile fashion in dorsal supine position with the left tilt.  After confirmation of adequate anesthesia, a low-transverse skin incision was made, which was carried down to the level of the fascia using a Bovie.  The fascia was incised in the midline, extended laterally bilaterally with the Mayo scissors.  The fascia was dissected off of the rectus muscles superiorly and inferiorly using Mayo scissors and Bovie for hemostasis.  The peritoneal cavity was entered bluntly and incision extended superiorly and inferiorly with the Bovie.  The peritoneal incision was stretched open and a bladder blade was placed.  A low-transverse incision was made in vesicouterine peritoneum and extended digitally with the Mayo scissors.  The bladder was dissected away from the lower uterine segment.  A low-transverse uterine incision was made into the uterine cavity.  Clear amniotic fluid was noted.  The baby was noted to be in the direct OP position and was delivered easily. The baby was bulb suctioned and the cord doubly clamped and cut.  The baby was handed off to the waiting pediatrician.  Cord blood was obtained and the placenta was manually extracted.  The uterus was wiped with a dry lap sponge to remove all residual membranes.  The uterine incision was closed with a running locked stitch  of #1 chromic. Hemostasis was obtained using the Bovie.  The posterior closed cervix was irrigated with copious amount of normal saline and noted to be clear.  The uterus was placed back on the maternal abdomen and the gutters were wiped with moist lap sponges bilaterally.  The uterine incision was reexamined.  Hemostasis was assured.  The fascial surface of the muscle surfaces were examined closely and hemostasis obtained using the Bovie.  The fascia was then closed with a running nonlocking stitch of #1 PDS.  The subcutaneous tissue was irrigated with copious amounts of  normal saline and hemostasis was obtained using the Bovie.  The subcutaneous tissue was reapproximated with interrupted sutures of 2-0 Vicryl.  The skin was then closed with subcuticular stitch of 4-0 Monocryl.  Steri-Strips and the bandage dressing were placed.  A vaginal cord exam was performed and small amounts of dark blood and clots were removed from the vagina.  The uterine fundus was noted to be firm.  Sponge, lap, and needle counts were correct x2.  The patient was transferred to the Postanesthesia Care Unit in stable and good condition.          ______________________________ Liberty Handy, MD     JB/MEDQ  D:  02/09/2011  T:  02/09/2011  Job:  [id number]

## 2011-02-10 NOTE — Progress Notes (Signed)
Post Partum Day 3  Subjective: no complaints, up ad lib, voiding, tolerating PO and + flatus  Objective: Blood pressure 124/78, pulse 88, temperature 97.7 F (36.5 C), temperature source Oral, resp. rate 20, height 5' 4.5" (1.638 m), weight 92.08 kg (203 lb), SpO2 99.00%, unknown if currently breastfeeding.  Physical Exam:  General: alert and cooperative Lochia: appropriate Uterine Fundus: firm Incision: healing well DVT Evaluation: No significant calf/ankle edema.   Basename 02/08/11 1707 02/08/11 1100  HGB 10.3* 10.4*  HCT 30.2* 30.4*    Assessment/Plan:  POD #3 s/p LTCS doing well  Discharge home and Breastfeeding Desires implanon for contraception Plan for f/u in 2 wks in the office     LOS: 4 days   Romone Shaff J. 02/10/2011, 8:31 AM

## 2011-02-10 NOTE — Discharge Summary (Signed)
Obstetric Discharge Summary Reason for Admission: induction of labor and BPP 6/8 at 40 wks 4 days  Prenatal Procedures: NST Intrapartum Procedures: cesarean: low cervical, transverse Postpartum Procedures: antibiotics Complications-Operative and Postpartum: pelvic infection  Hemoglobin  Date Value Range Status  02/08/2011 10.3* 12.0-15.0 (g/dL) Final     HCT  Date Value Range Status  02/08/2011 30.2* 36.0-46.0 (%) Final    Discharge Diagnoses: Term Pregnancy-delivered and Amnionitis  Discharge Information: Date: 02/10/2011 Activity: pelvic rest Diet: routine Medications: PNV, Ibuprophen and Percocet Condition: stable Instructions: refer to practice specific booklet Discharge to: home Follow-up Information    Follow up with Nicholous Girgenti J. in 2 weeks. (call for an appt [id number])    Contact information:   [address] [place] [address] [phone]          Newborn Data: Live born  Information for the patient's newborn:  Shakeda, Pearse [[id number]]  female ; APGAR 9/9  , ; weight ;  Home with mother.  Kaleo Condrey J. 02/10/2011, 8:38 AM

## 2011-02-10 NOTE — Progress Notes (Signed)
Assisted with good deep latch using SNS, gave 17 ml EBM, tolerated well, parents very receptive to teaching. Mom to pick up [facility] pump today. Scheduled outpatient visit.lAugust 1 at 9 am. Encouraged to breast feed first using sns and if unable to latch well use nipple shield.

## 2011-02-11 ENCOUNTER — Inpatient Hospital Stay (HOSPITAL_COMMUNITY)
Admission: RE | Admit: 2011-02-11 | Payer: Medicaid Other | Source: Ambulatory Visit | Admitting: Obstetrics and Gynecology

## 2011-02-18 ENCOUNTER — Ambulatory Visit (HOSPITAL_COMMUNITY)
Admit: 2011-02-18 | Discharge: 2011-02-18 | Disposition: A | Discharge status: Home / Self Care | Payer: Medicaid Other | Attending: Obstetrics and Gynecology | Admitting: Obstetrics and Gynecology

## 2011-04-09 ENCOUNTER — Inpatient Hospital Stay (INDEPENDENT_AMBULATORY_CARE_PROVIDER_SITE_OTHER)
Admission: RE | Admit: 2011-04-09 | Discharge: 2011-04-09 | Disposition: A | Discharge status: Home / Self Care | Payer: BC Managed Care – PPO | Source: Ambulatory Visit | Attending: Emergency Medicine | Admitting: Emergency Medicine

## 2011-04-09 DIAGNOSIS — K5900 Constipation, unspecified: Secondary | ICD-10-CM

## 2011-04-09 DIAGNOSIS — R059 Cough, unspecified: Secondary | ICD-10-CM

## 2011-04-09 DIAGNOSIS — R599 Enlarged lymph nodes, unspecified: Secondary | ICD-10-CM

## 2011-04-09 DIAGNOSIS — R05 Cough: Secondary | ICD-10-CM

## 2011-04-09 LAB — POCT URINALYSIS DIP (DEVICE)
Glucose, UA: NEGATIVE mg/dL
Leukocytes, UA: NEGATIVE
Protein, ur: NEGATIVE mg/dL
Urobilinogen, UA: 0.2 mg/dL (ref 0.0–1.0)

## 2011-04-09 LAB — POCT PREGNANCY, URINE: Preg Test, Ur: NEGATIVE

## 2011-04-21 ENCOUNTER — Encounter (HOSPITAL_COMMUNITY): Payer: Self-pay | Admitting: Obstetrics and Gynecology

## 2011-08-07 ENCOUNTER — Other Ambulatory Visit: Payer: Self-pay | Admitting: Obstetrics and Gynecology

## 2011-08-07 ENCOUNTER — Other Ambulatory Visit (HOSPITAL_COMMUNITY)
Admission: RE | Admit: 2011-08-07 | Discharge: 2011-08-07 | Disposition: A | Discharge status: Home / Self Care | Payer: BC Managed Care – PPO | Source: Ambulatory Visit | Attending: Obstetrics and Gynecology | Admitting: Obstetrics and Gynecology

## 2011-08-07 DIAGNOSIS — Z01419 Encounter for gynecological examination (general) (routine) without abnormal findings: Secondary | ICD-10-CM | POA: Insufficient documentation

## 2011-08-07 DIAGNOSIS — Z113 Encounter for screening for infections with a predominantly sexual mode of transmission: Secondary | ICD-10-CM | POA: Insufficient documentation

## 2011-11-04 ENCOUNTER — Emergency Department (HOSPITAL_COMMUNITY)
Admission: EM | Admit: 2011-11-04 | Discharge: 2011-11-04 | Disposition: A | Discharge status: Home / Self Care | Payer: BC Managed Care – PPO | Attending: Emergency Medicine | Admitting: Emergency Medicine

## 2011-11-04 ENCOUNTER — Encounter (HOSPITAL_COMMUNITY): Payer: Self-pay | Admitting: *Deleted

## 2011-11-04 DIAGNOSIS — R1084 Generalized abdominal pain: Secondary | ICD-10-CM | POA: Insufficient documentation

## 2011-11-04 DIAGNOSIS — J45909 Unspecified asthma, uncomplicated: Secondary | ICD-10-CM | POA: Insufficient documentation

## 2011-11-04 DIAGNOSIS — R112 Nausea with vomiting, unspecified: Secondary | ICD-10-CM | POA: Insufficient documentation

## 2011-11-04 LAB — URINALYSIS, MICROSCOPIC ONLY
Bilirubin Urine: NEGATIVE
Ketones, ur: NEGATIVE mg/dL
Leukocytes, UA: NEGATIVE
Nitrite: NEGATIVE
Specific Gravity, Urine: 1.024 (ref 1.005–1.030)
Urobilinogen, UA: 1 mg/dL (ref 0.0–1.0)

## 2011-11-04 LAB — BASIC METABOLIC PANEL
BUN: 9 mg/dL (ref 6–23)
CO2: 22 mEq/L (ref 19–32)
Chloride: 106 mEq/L (ref 96–112)
GFR calc Af Amer: >90 mL/min (ref >90)
Glucose, Bld: 104 mg/dL — ABNORMAL HIGH (ref 70–99)
Potassium: 3.9 mEq/L (ref 3.5–5.1)

## 2011-11-04 LAB — PREGNANCY, URINE: Preg Test, Ur: NEGATIVE

## 2011-11-04 LAB — CBC
HCT: 37.4 % (ref 36.0–46.0)
Hemoglobin: 12.6 g/dL (ref 12.0–15.0)
WBC: 9.7 10*3/uL (ref 4.0–10.5)

## 2011-11-04 MED ORDER — HYDROMORPHONE HCL PF 1 MG/ML IJ SOLN
1.0000 mg | Freq: Once | INTRAMUSCULAR | Status: AC
  Administered 2011-11-04: 1 mg via INTRAVENOUS
  Filled 2011-11-04: qty 1

## 2011-11-04 MED ORDER — ONDANSETRON HCL 4 MG/2ML IJ SOLN
INTRAMUSCULAR | Status: AC
  Administered 2011-11-04: 4 mg
  Filled 2011-11-04: qty 2

## 2011-11-04 MED ORDER — HYDROCODONE-ACETAMINOPHEN 5-325 MG PO TABS: 1.0000 | ORAL_TABLET | ORAL | Status: AC | PRN

## 2011-11-04 NOTE — ED Notes (Signed)
[id number]<BJ> Expected date:<BR> Expected time:<BR> Means of arrival:<BR> Comments:<BR> Emesis Diarrhea

## 2011-11-04 NOTE — ED Notes (Signed)
Pt ambulated to the restroom however was unable to the restroom

## 2011-11-04 NOTE — Discharge Instructions (Signed)

## 2011-11-04 NOTE — ED Notes (Signed)
Pt from home, reported abdominal pain, nausea and vomiting starting an hour and half before arrival. Pt complainin gof generalized abdominal pain 10/10. [facility] started piv in left a/c 20g. Received 4mg  zofran and 500cc NS bolus. VSS stable. Reports nausea and vomiting better at this time.

## 2011-11-04 NOTE — ED Provider Notes (Signed)
History     CSN: [id number]  Arrival date & time 11/04/11  0021   First MD Initiated Contact with Patient 11/04/11 0028      Chief Complaint  Patient presents with  . Abdominal Pain     The history is provided by the patient.   patient reports the onset of severe nausea and vomiting as well as generalized abdominal pain.  This is nonlocalized.  She reports this came on abruptly and thus EMS was called.  She received Zofran and fluids in route and states that she feels better at this time but still has some abdominal cramping.  She denies diarrhea.  She's never had symptoms similar to this before.  She denies flank pain.  She has no dysuria or urinary frequency.  Her pain at this time is mild to moderate.  Nothing worsens her symptoms.  Nothing improves her symptoms.  Symptoms are constant yet improving  Past Medical History  Diagnosis Date  . Asthma   . Abnormal Pap smear     Past Surgical History  Procedure Date  . Tonsillectomy and adenoidectomy 2010  . Adenoidectomy   . Cesarean section 02/07/2011    Procedure: CESAREAN SECTION;  Surgeon: Jessee Avers;  Location: [facility];  Service: Gynecology;  Laterality: N/A;  Primary cesarean section with delivery of baby boy at 13.Apgars 9/9.    History reviewed. No pertinent family history.  History  Substance Use Topics  . Smoking status: Never Smoker   . Smokeless tobacco: Not on file  . Alcohol Use: No    OB History    Grav Para Term Preterm Abortions TAB SAB Ect Mult Living   3 1 1  2 2    1       Review of Systems  Gastrointestinal: Positive for abdominal pain.  All other systems reviewed and are negative.    Allergies  Nasonex; Peanuts; and Sulfa antibiotics  Home Medications   Current Outpatient Rx  Name Route Sig Dispense Refill  . ALBUTEROL SULFATE HFA 108 (90 BASE) MCG/ACT IN AERS Inhalation Inhale 1 puff into the lungs every 6 (six) hours as needed.    Marland Kitchen CETIRIZINE HCL 10 MG PO TABS Oral Take 10 mg by  mouth daily.    Marland Kitchen HYDROCODONE-ACETAMINOPHEN 5-325 MG PO TABS Oral Take 1 tablet by mouth every 4 (four) hours as needed for pain. 15 tablet 0    BP 118/81  Pulse 58  Temp(Src) 98.6 F (37 C) (Oral)  Resp 18  SpO2 100%  Breastfeeding? Unknown  Physical Exam  Nursing note and vitals reviewed. Constitutional: She is oriented to person, place, and time. She appears well-developed and well-nourished. No distress.  HENT:  Head: Normocephalic and atraumatic.  Eyes: EOM are normal.  Neck: Normal range of motion.  Cardiovascular: Normal rate, regular rhythm and normal heart sounds.   Pulmonary/Chest: Effort normal and breath sounds normal.  Abdominal: Soft. She exhibits no distension. There is no tenderness. There is no rebound and no guarding.  Musculoskeletal: Normal range of motion.  Neurological: She is alert and oriented to person, place, and time.  Skin: Skin is warm and dry.  Psychiatric: She has a normal mood and affect. Judgment normal.    ED Course  Procedures (including critical care time)  Labs Reviewed  URINALYSIS, WITH MICROSCOPIC - Abnormal; Notable for the following:    Bacteria, UA FEW (*)    All other components within normal limits  CBC - Abnormal; Notable for the following:  RBC 3.86 (*)    All other components within normal limits  BASIC METABOLIC PANEL - Abnormal; Notable for the following:    Glucose, Bld 104 (*)    All other components within normal limits  PREGNANCY, URINE   No results found.   1. Abdominal pain       MDM  Patient reports acute onset abdominal pain nausea and vomiting.  The nausea and vomiting is significantly improved at this time.  Her abdominal exam is benign.  This may reoccur present ovarian cystic-type pain.  My suspicion that this is ovarian torsion is very low.  Patient feels better and is stable for discharge at this time.  Close PCP followup.  She understands the importance of returning the ER for new worsening  symptoms        Lyanne Co, MD 11/04/11 [phone]

## 2011-11-04 NOTE — ED Notes (Signed)
Pt able to void on own, in and out catheter cancelled

## 2011-11-04 NOTE — ED Notes (Signed)
Pt cleaning herself up and getting dressed at this time

## 2011-11-04 NOTE — ED Notes (Signed)
Pt is aware of the need for urine, however is unable at this time.

## 2012-08-22 ENCOUNTER — Other Ambulatory Visit (HOSPITAL_COMMUNITY)
Admission: RE | Admit: 2012-08-22 | Discharge: 2012-08-22 | Disposition: A | Discharge status: Home / Self Care | Payer: BC Managed Care – PPO | Source: Ambulatory Visit | Attending: Obstetrics and Gynecology | Admitting: Obstetrics and Gynecology

## 2012-08-22 ENCOUNTER — Other Ambulatory Visit: Payer: Self-pay | Admitting: Obstetrics and Gynecology

## 2012-08-22 DIAGNOSIS — Z01419 Encounter for gynecological examination (general) (routine) without abnormal findings: Secondary | ICD-10-CM | POA: Insufficient documentation

## 2012-08-22 DIAGNOSIS — Z113 Encounter for screening for infections with a predominantly sexual mode of transmission: Secondary | ICD-10-CM | POA: Insufficient documentation

## 2012-08-22 DIAGNOSIS — N760 Acute vaginitis: Secondary | ICD-10-CM | POA: Insufficient documentation

## 2012-09-22 ENCOUNTER — Emergency Department (HOSPITAL_COMMUNITY)
Admission: EM | Admit: 2012-09-22 | Discharge: 2012-09-23 | Disposition: A | Discharge status: Home / Self Care | Payer: BC Managed Care – PPO | Attending: Emergency Medicine | Admitting: Emergency Medicine

## 2012-09-22 ENCOUNTER — Encounter (HOSPITAL_COMMUNITY): Payer: Self-pay | Admitting: *Deleted

## 2012-09-22 DIAGNOSIS — R197 Diarrhea, unspecified: Secondary | ICD-10-CM | POA: Insufficient documentation

## 2012-09-22 DIAGNOSIS — B9789 Other viral agents as the cause of diseases classified elsewhere: Secondary | ICD-10-CM | POA: Insufficient documentation

## 2012-09-22 DIAGNOSIS — R112 Nausea with vomiting, unspecified: Secondary | ICD-10-CM

## 2012-09-22 DIAGNOSIS — Z3202 Encounter for pregnancy test, result negative: Secondary | ICD-10-CM | POA: Insufficient documentation

## 2012-09-22 DIAGNOSIS — R51 Headache: Secondary | ICD-10-CM | POA: Insufficient documentation

## 2012-09-22 DIAGNOSIS — Z79899 Other long term (current) drug therapy: Secondary | ICD-10-CM | POA: Insufficient documentation

## 2012-09-22 DIAGNOSIS — J45909 Unspecified asthma, uncomplicated: Secondary | ICD-10-CM | POA: Insufficient documentation

## 2012-09-22 DIAGNOSIS — R109 Unspecified abdominal pain: Secondary | ICD-10-CM | POA: Insufficient documentation

## 2012-09-22 DIAGNOSIS — B349 Viral infection, unspecified: Secondary | ICD-10-CM

## 2012-09-22 LAB — CBC WITH DIFFERENTIAL/PLATELET
Basophils Absolute: 0 10*3/uL (ref 0.0–0.1)
Basophils Relative: 0 % (ref 0–1)
HCT: 36.9 % (ref 36.0–46.0)
Hemoglobin: 13.1 g/dL (ref 12.0–15.0)
Lymphocytes Relative: 12 % (ref 12–46)
Monocytes Absolute: 0.5 10*3/uL (ref 0.1–1.0)
Neutro Abs: 3.8 10*3/uL (ref 1.7–7.7)
Neutrophils Relative %: 77 % (ref 43–77)
RDW: 12.7 % (ref 11.5–15.5)
WBC: 5 10*3/uL (ref 4.0–10.5)

## 2012-09-22 LAB — POCT PREGNANCY, URINE: Preg Test, Ur: NEGATIVE

## 2012-09-22 LAB — URINALYSIS, ROUTINE W REFLEX MICROSCOPIC
Bilirubin Urine: NEGATIVE
Glucose, UA: NEGATIVE mg/dL
Ketones, ur: NEGATIVE mg/dL
Protein, ur: NEGATIVE mg/dL
Urobilinogen, UA: 1 mg/dL (ref 0.0–1.0)

## 2012-09-22 LAB — COMPREHENSIVE METABOLIC PANEL
ALT: 13 U/L (ref 0–35)
AST: 22 U/L (ref 0–37)
Albumin: 4 g/dL (ref 3.5–5.2)
Alkaline Phosphatase: 50 U/L (ref 39–117)
CO2: 25 mEq/L (ref 19–32)
Chloride: 102 mEq/L (ref 96–112)
Creatinine, Ser: 0.92 mg/dL (ref 0.50–1.10)
GFR calc non Af Amer: 88 mL/min — ABNORMAL LOW (ref >90)
Potassium: 3.8 mEq/L (ref 3.5–5.1)
Total Bilirubin: 0.6 mg/dL (ref 0.3–1.2)

## 2012-09-22 LAB — POCT I-STAT, CHEM 8
BUN: 10 mg/dL (ref 6–23)
Creatinine, Ser: 1 mg/dL (ref 0.50–1.10)
Glucose, Bld: 109 mg/dL — ABNORMAL HIGH (ref 70–99)
Hemoglobin: 13.3 g/dL (ref 12.0–15.0)
Sodium: 139 mEq/L (ref 135–145)
TCO2: 27 mmol/L (ref 0–100)

## 2012-09-22 LAB — URINE MICROSCOPIC-ADD ON

## 2012-09-22 MED ORDER — SODIUM CHLORIDE 0.9 % IV SOLN: 1000.0000 mL | INTRAVENOUS | Status: DC

## 2012-09-22 MED ORDER — SODIUM CHLORIDE 0.9 % IV SOLN
1000.0000 mL | Freq: Once | INTRAVENOUS | Status: AC
  Administered 2012-09-22: 1000 mL via INTRAVENOUS

## 2012-09-22 MED ORDER — SODIUM CHLORIDE 0.9 % IV SOLN
1000.0000 mL | Freq: Once | INTRAVENOUS | Status: AC
  Administered 2012-09-23: 1000 mL via INTRAVENOUS

## 2012-09-22 MED ORDER — MORPHINE SULFATE 2 MG/ML IJ SOLN
2.0000 mg | Freq: Once | INTRAMUSCULAR | Status: AC
  Administered 2012-09-22: 2 mg via INTRAVENOUS
  Filled 2012-09-22: qty 1

## 2012-09-22 MED ORDER — ACETAMINOPHEN 325 MG PO TABS
650.0000 mg | ORAL_TABLET | Freq: Once | ORAL | Status: AC
  Administered 2012-09-22: 650 mg via ORAL

## 2012-09-22 MED ORDER — ONDANSETRON HCL 4 MG/2ML IJ SOLN
4.0000 mg | Freq: Once | INTRAMUSCULAR | Status: AC
  Administered 2012-09-22: 4 mg via INTRAVENOUS
  Filled 2012-09-22: qty 2

## 2012-09-22 NOTE — ED Notes (Signed)
Pt c/o generalized body aches and mid epigastric pain. Pt reports she had n/v/d this morning but no longer experiencing that now. Went to pcp and was told if she got worse to go to ED for further eval.

## 2012-09-22 NOTE — ED Notes (Signed)
Pt states she went to her PCP today, c/o n/v/d since early this morning.  States PCP checked bloodwork to look "at my gallbladder".  Got rx for "nausea medicine".  Now, c/o body aches, SOB, and migraine.

## 2012-09-23 ENCOUNTER — Emergency Department (HOSPITAL_COMMUNITY): Payer: BC Managed Care – PPO

## 2012-09-23 MED ORDER — PROMETHAZINE HCL 25 MG RE SUPP: 25.0000 mg | Freq: Four times a day (QID) | RECTAL | Status: DC | PRN

## 2012-09-23 MED ORDER — PROMETHAZINE HCL 25 MG PO TABS: 25.0000 mg | ORAL_TABLET | Freq: Four times a day (QID) | ORAL | Status: DC | PRN

## 2012-09-23 MED ORDER — LOPERAMIDE HCL 2 MG PO CAPS
4.0000 mg | ORAL_CAPSULE | Freq: Once | ORAL | Status: AC
  Administered 2012-09-23: 4 mg via ORAL
  Filled 2012-09-23: qty 2

## 2012-09-23 NOTE — ED Provider Notes (Signed)
History     CSN: [id number]  Arrival date & time 09/22/12  3086   First MD Initiated Contact with Patient 09/22/12 2258      Chief Complaint  Patient presents with  . Generalized Body Aches  . Diarrhea    (Consider location/radiation/quality/duration/timing/severity/associated sxs/prior treatment) HPI 22 year old female presents to emergency apartment with complaint of nausea vomiting diarrhea and upper abdominal pain. Patient reports she started having diarrhea this morning at 5 AM, waking her from sleep. She denies any travel, no sick contacts. For dinner last night she had to be in areas which is seen in the past. Patient then developed nausea and vomiting. She estimates she vomited about 6-8 times today. She was seen at her primary care doctor's office, had lab work and was recommended ultrasound, but due to 2 persistent vomiting they deferred it at this time. Patient was given Zofran. She reports the nausea vomiting and diarrhea has resolved, but she feels generalized aching, upper abdominal pain, and headache. Patient reports she's had fluid in the past, and feels as though she has the flu. She did not get a flu shot this year.  Past Medical History  Diagnosis Date  . Asthma   . Abnormal Pap smear     Past Surgical History  Procedure Laterality Date  . Tonsillectomy and adenoidectomy  2010  . Adenoidectomy    . Cesarean section  02/07/2011    Procedure: CESAREAN SECTION;  Surgeon: Jessee Avers;  Location: [facility];  Service: Gynecology;  Laterality: N/A;  Primary cesarean section with delivery of baby boy at 72.Apgars 9/9.    History reviewed. No pertinent family history.  History  Substance Use Topics  . Smoking status: Never Smoker   . Smokeless tobacco: Not on file  . Alcohol Use: Yes     Comment: occ    OB History   Grav Para Term Preterm Abortions TAB SAB Ect Mult Living   3 1 1  2 2    1       Review of Systems  See History of Present Illness; otherwise all  other systems are reviewed and negative  Allergies  Mometasone furoate; Peanuts; and Sulfa antibiotics  Home Medications   Current Outpatient Rx  Name  Route  Sig  Dispense  Refill  . albuterol (PROVENTIL HFA;VENTOLIN HFA) 108 (90 BASE) MCG/ACT inhaler   Inhalation   Inhale 1 puff into the lungs every 6 (six) hours as needed for shortness of breath.          . Multiple Vitamins-Minerals (MULTIVITAMIN PO)   Oral   Take 1 tablet by mouth daily.         . ondansetron (ZOFRAN) 4 MG tablet   Oral   Take 4 mg by mouth every 8 (eight) hours as needed for nausea.           BP 112/54  Pulse 89  Temp(Src) 99.1 F (37.3 C) (Oral)  Resp 20  SpO2 98%  Physical Exam  Nursing note and vitals reviewed. Constitutional: She is oriented to person, place, and time. She appears well-developed and well-nourished.  HENT:  Head: Normocephalic and atraumatic.  Nose: Nose normal.  Mouth/Throat: Oropharynx is clear and moist.  Eyes: Conjunctivae and EOM are normal. Pupils are equal, round, and reactive to light.  Neck: Normal range of motion. Neck supple. No JVD present. No tracheal deviation present. No thyromegaly present.  Cardiovascular: Normal rate, regular rhythm, normal heart sounds and intact distal pulses.  Exam reveals  no gallop and no friction rub.   No murmur heard. Pulmonary/Chest: Effort normal and breath sounds normal. No stridor. No respiratory distress. She has no wheezes. She has no rales. She exhibits no tenderness.  Abdominal: Soft. She exhibits no distension and no mass. There is tenderness (diffuse tenderness across entire abdomen worse in epigastrium). There is no rebound and no guarding.  Hypoactive bowel sounds  Musculoskeletal: Normal range of motion. She exhibits no edema and no tenderness.  Lymphadenopathy:    She has no cervical adenopathy.  Neurological: She is alert and oriented to person, place, and time. She exhibits normal muscle tone. Coordination normal.   Skin: Skin is warm and dry. No rash noted. No erythema. No pallor.  Psychiatric: She has a normal mood and affect. Her behavior is normal. Judgment and thought content normal.    ED Course  Procedures (including critical care time)  Labs Reviewed  CBC WITH DIFFERENTIAL - Abnormal; Notable for the following:    Lymphs Abs 0.6 (*)    All other components within normal limits  COMPREHENSIVE METABOLIC PANEL - Abnormal; Notable for the following:    Glucose, Bld 112 (*)    GFR calc non Af Amer 88 (*)    All other components within normal limits  URINALYSIS, ROUTINE W REFLEX MICROSCOPIC - Abnormal; Notable for the following:    Hgb urine dipstick TRACE (*)    Leukocytes, UA SMALL (*)    All other components within normal limits  URINE MICROSCOPIC-ADD ON - Abnormal; Notable for the following:    Squamous Epithelial / LPF FEW (*)    All other components within normal limits  POCT I-STAT, CHEM 8 - Abnormal; Notable for the following:    Glucose, Bld 109 (*)    All other components within normal limits  POCT PREGNANCY, URINE   US Abdomen Complete  09/23/2012  *RADIOLOGY REPORT*  Clinical Data:  Upper abdominal pain, nausea and vomiting and diarrhea.  COMPLETE ABDOMINAL ULTRASOUND  Comparison:  None.  Findings:  Gallbladder:  No gallstones, gallbladder wall thickening, or pericholecystic fluid.  Common bile duct:  Normal 3.6 mm.  Liver:  No focal lesion identified.  Within normal limits in parenchymal echogenicity.  IVC:  Appears normal.  Pancreas:  No focal abnormality seen.  Spleen:  Normal size and echogenicity.  Right Kidney:  No hydronephrosis  Left Kidney:  No hydronephrosis  Abdominal aorta:  No aneurysm identified.  IMPRESSION: Negative abdominal ultrasound.   Original Report Authenticated By: Genevive Bi, M.D.      1. Nausea vomiting and diarrhea   2. Abdominal pain, acute   3. Viral syndrome       MDM  22 year old female with one day of nausea vomiting and diarrhea.  Suspect viral syndrome. Labs are reassuring. Will get abdominal ultrasound for possible cholelithiasis I think this is less likely. Will treat with IV fluids, pain and nausea medicines.        Olivia Mackie, MD 09/23/12 [phone]

## 2013-03-04 ENCOUNTER — Encounter (HOSPITAL_COMMUNITY): Payer: Self-pay

## 2013-03-04 ENCOUNTER — Emergency Department (HOSPITAL_COMMUNITY): Payer: BC Managed Care – PPO

## 2013-03-04 ENCOUNTER — Emergency Department (HOSPITAL_COMMUNITY)
Admission: EM | Admit: 2013-03-04 | Discharge: 2013-03-04 | Disposition: A | Discharge status: Home / Self Care | Payer: BC Managed Care – PPO | Attending: Emergency Medicine | Admitting: Emergency Medicine

## 2013-03-04 DIAGNOSIS — IMO0002 Reserved for concepts with insufficient information to code with codable children: Secondary | ICD-10-CM | POA: Insufficient documentation

## 2013-03-04 DIAGNOSIS — M25569 Pain in unspecified knee: Secondary | ICD-10-CM | POA: Insufficient documentation

## 2013-03-04 DIAGNOSIS — Y9311 Activity, swimming: Secondary | ICD-10-CM | POA: Insufficient documentation

## 2013-03-04 DIAGNOSIS — Z79899 Other long term (current) drug therapy: Secondary | ICD-10-CM | POA: Insufficient documentation

## 2013-03-04 DIAGNOSIS — Y9239 Other specified sports and athletic area as the place of occurrence of the external cause: Secondary | ICD-10-CM | POA: Insufficient documentation

## 2013-03-04 DIAGNOSIS — J45909 Unspecified asthma, uncomplicated: Secondary | ICD-10-CM | POA: Insufficient documentation

## 2013-03-04 DIAGNOSIS — W19XXXA Unspecified fall, initial encounter: Secondary | ICD-10-CM

## 2013-03-04 DIAGNOSIS — M79675 Pain in left toe(s): Secondary | ICD-10-CM

## 2013-03-04 DIAGNOSIS — M25562 Pain in left knee: Secondary | ICD-10-CM

## 2013-03-04 DIAGNOSIS — W010XXA Fall on same level from slipping, tripping and stumbling without subsequent striking against object, initial encounter: Secondary | ICD-10-CM | POA: Insufficient documentation

## 2013-03-04 NOTE — ED Notes (Signed)
Pt returns to room from xray.

## 2013-03-04 NOTE — ED Notes (Signed)
Pt alert, nad, arrives from swimming class, c/o left knee pain after slip fall injury, resp even unlabored, skin pwd

## 2013-03-04 NOTE — ED Provider Notes (Signed)
Medical screening examination/treatment/procedure(s) were performed by non-physician practitioner and as supervising physician I was immediately available for consultation/collaboration.  Hadlee Burback N Chailyn Racette, DO 03/04/13 1533 

## 2013-03-04 NOTE — ED Notes (Signed)
She states she slipped/.tripped at a local pool.  She c/o pain left knee/ankle/foot and second toe.

## 2013-03-04 NOTE — ED Provider Notes (Signed)
CSN: [id number]     Arrival date & time 03/04/13  1115 History     First MD Initiated Contact with Patient 03/04/13 1126     Chief Complaint  Patient presents with  . Leg Injury    Right   (Consider location/radiation/quality/duration/timing/severity/associated sxs/prior Treatment) The history is provided by the patient and medical records.   Patient presents to the ED left knee and left second toe pain after falling on a pool deck. Patient was at a swimming class when she slipped on some water, falling directly onto left knee.  Denies head trauma or LOC.  States her left second toe "bent the wrong way" and now it feels "funny."  Denies any numbness or paresthesias of extremities.  Pt is ambulatory, but states it is painful.  No prior left knee or foot injury.  No meds taken PTA.  Past Medical History  Diagnosis Date  . Asthma   . Abnormal Pap smear    Past Surgical History  Procedure Laterality Date  . Tonsillectomy and adenoidectomy  2010  . Adenoidectomy    . Cesarean section  02/07/2011    Procedure: CESAREAN SECTION;  Surgeon: Jessee Avers;  Location: [facility];  Service: Gynecology;  Laterality: N/A;  Primary cesarean section with delivery of baby boy at 7.Apgars 9/9.   History reviewed. No pertinent family history. History  Substance Use Topics  . Smoking status: Never Smoker   . Smokeless tobacco: Not on file  . Alcohol Use: Yes     Comment: occ   OB History   Grav Para Term Preterm Abortions TAB SAB Ect Mult Living   3 1 1  2 2    1      Review of Systems  Musculoskeletal: Positive for arthralgias.  All other systems reviewed and are negative.    Allergies  Mometasone furoate; Peanuts; and Sulfa antibiotics  Home Medications   Current Outpatient Rx  Name  Route  Sig  Dispense  Refill  . albuterol (PROVENTIL HFA;VENTOLIN HFA) 108 (90 BASE) MCG/ACT inhaler   Inhalation   Inhale 1 puff into the lungs every 6 (six) hours as needed for shortness of breath.          . hydrocortisone cream 1 %   Topical   Apply 1 application topically 2 (two) times daily as needed (eczema).          BP 144/66  Pulse 89  Temp(Src) 98 F (36.7 C) (Oral)  Resp 16  Wt 170 lb (77.111 kg)  BMI 28.74 kg/m2  SpO2 99%  LMP 02/20/2013  Physical Exam  Nursing note and vitals reviewed. Constitutional: She is oriented to person, place, and time. She appears well-developed and well-nourished. No distress.  HENT:  Head: Normocephalic and atraumatic.  Mouth/Throat: Oropharynx is clear and moist.  Eyes: Conjunctivae and EOM are normal. Pupils are equal, round, and reactive to light.  Neck: Normal range of motion. Neck supple.  Cardiovascular: Normal rate, regular rhythm and normal heart sounds.   Pulmonary/Chest: Effort normal and breath sounds normal. No respiratory distress. She has no wheezes.  Musculoskeletal: She exhibits no edema.       Left knee: She exhibits normal range of motion, no swelling, no effusion, no ecchymosis, no deformity, no laceration and no erythema. Tenderness found.       Left foot: She exhibits tenderness and bony tenderness. She exhibits no swelling and normal capillary refill.  Diffuse TTP of left knee, no swelling, bruising, deformity, abrasion, or laceration  noted; no crepitus Left 2nd toe TTP with small abrasion along medial margin, no swelling, bruising, or deformity, limited flexion/extension due to pain Strong distal pulse, strong cap refill, sensation intact  Neurological: She is alert and oriented to person, place, and time.  Skin: Skin is warm and dry. She is not diaphoretic.  Psychiatric: She has a normal mood and affect.    ED Course   Procedures (including critical care time)  Labs Reviewed - No data to display Dg Knee Complete 4 Views Left  03/04/2013   *RADIOLOGY REPORT*  Clinical Data: Knee pain post fall  LEFT KNEE - COMPLETE 4+ VIEW  Comparison: None.  Findings: Four views of the left knee submitted.  No acute  fracture or subluxation.  No radiopaque foreign body.  IMPRESSION: No acute fracture or subluxation.   Original Report Authenticated By: Natasha Mead, M.D.   Dg Toe 2nd Left  03/04/2013   *RADIOLOGY REPORT*  Clinical Data:  second toe pain post fall  LEFT SECOND TOE  Comparison: None.  Findings: Three views of the left second toe submitted.  No acute fracture or subluxation.  No radiopaque foreign body.  IMPRESSION: No acute fracture or subluxation.   Original Report Authenticated By: Natasha Mead, M.D.   1. Fall from standing, initial encounter   2. Knee pain, acute, left   3. Toe pain, left     MDM   X-rays negative for acute fracture or dislocation. I've discussed imaging results with patient. May take over-the-counter anti-inflammatories as needed for pain. Followup with primary care physician if symptoms not improving in the next few days. Discussed plan with patient, she agreed. Return precautions advised.  Garlon Hatchet, PA-C 03/04/13 1444

## 2014-05-21 ENCOUNTER — Encounter (HOSPITAL_COMMUNITY): Payer: Self-pay

## 2017-03-17 ENCOUNTER — Encounter: Payer: Managed Care, Other (non HMO) | Admitting: Podiatry

## 2017-03-24 NOTE — Progress Notes (Signed)
This encounter was created in error - please disregard.

## 2017-04-02 ENCOUNTER — Ambulatory Visit: Payer: Managed Care, Other (non HMO) | Admitting: Podiatry

## 2017-04-05 ENCOUNTER — Ambulatory Visit: Payer: Self-pay | Admitting: Allergy

## 2017-04-16 ENCOUNTER — Encounter: Payer: Self-pay | Admitting: Podiatry

## 2017-04-16 ENCOUNTER — Other Ambulatory Visit: Payer: Self-pay | Admitting: Podiatry

## 2017-04-16 ENCOUNTER — Ambulatory Visit (INDEPENDENT_AMBULATORY_CARE_PROVIDER_SITE_OTHER): Payer: Managed Care, Other (non HMO)

## 2017-04-16 ENCOUNTER — Ambulatory Visit (INDEPENDENT_AMBULATORY_CARE_PROVIDER_SITE_OTHER): Payer: Managed Care, Other (non HMO) | Admitting: Podiatry

## 2017-04-16 VITALS — BP 113/61 | HR 73

## 2017-04-16 DIAGNOSIS — M79672 Pain in left foot: Secondary | ICD-10-CM

## 2017-04-16 DIAGNOSIS — M2141 Flat foot [pes planus] (acquired), right foot: Secondary | ICD-10-CM

## 2017-04-16 DIAGNOSIS — M779 Enthesopathy, unspecified: Secondary | ICD-10-CM

## 2017-04-16 DIAGNOSIS — M79671 Pain in right foot: Secondary | ICD-10-CM

## 2017-04-16 DIAGNOSIS — M2142 Flat foot [pes planus] (acquired), left foot: Secondary | ICD-10-CM | POA: Diagnosis not present

## 2017-04-16 MED ORDER — TRIAMCINOLONE ACETONIDE 10 MG/ML IJ SUSP
10.0000 mg | Freq: Once | INTRAMUSCULAR | Status: AC
  Administered 2017-04-16: 10 mg

## 2017-04-16 NOTE — Progress Notes (Signed)
   Subjective:    Patient ID: Amanda Patrick, female    DOB: 04/09/1991, 26 y.o.   MRN: [id number]  HPI  Chief Complaint  Patient presents with  . Foot Pain    B/L Hx of surgery in Lt foot, having similar pain in rt foot onset 4 mos       Review of Systems  Musculoskeletal: Positive for arthralgias, back pain and myalgias.  Allergic/Immunologic: Positive for food allergies.  All other systems reviewed and are negative.      Objective:   Physical Exam        Assessment & Plan:

## 2017-04-16 NOTE — Progress Notes (Signed)
Subjective:    Patient ID: Amanda Patrick, female   DOB: 26 y.o.   MRN: [id number]   HPI patient presents with history of having a torn posterior tibial tendon repair December 2016 along with beginnings of discomfort in the right one with flatfoot deformity and also pain in her forefoot with probable history of rheumatoid arthritis that she is starting infusion medicine on in the next 7 days. Patient does not smoke    Review of Systems  All other systems reviewed and are negative.       Objective:  Physical Exam  Constitutional: She appears well-developed and well-nourished.  Cardiovascular: Intact distal pulses.   Pulmonary/Chest: Effort normal.  Neurological: She is alert.  Skin: Skin is warm.  Nursing note and vitals reviewed.  neurovascular status was found to be intact muscle strength was adequate range of motion within normal limits with moderate flatfoot deformity noted bilateral. Scar from previous posterior tibial surgery left with inflammation of the posterior tibial as it inserts into the navicular on the right with no indication currently of tendon dysfunction. Patient's found to have quite a bit of inflammation of the left second MPJ and also is noted to have bunion deformity bilateral that are symptomatic painful and patient states she's tried wider shoes soaks and other modalities without relief of symptoms     Assessment:    Tendinitis right posterior tib with inflammatory capsulitis left and structural bunion deformity with flatfoot deformity and rheumatoid arthritis as part of the pathology     Plan:    H&P x-rays reviewed condition discussed. At this point I did a sheath injection right 3 mg Kenalog 5 mg Xylocaine to try to reduce inflammation and applied fascial brace and instructed on her to wear her boot that she has at home and I did a capsular injection left taken out a small amount of fluid that looked clear and injected quarter cc deck some some Kenalog and  applied padding. Gave instructions on reduced activity and reappoint 2 weeks to see results and we'll definitely need long-term orthotic therapy  X-rays indicate that there is moderate depression of the arch with no other significant forefoot pathology with structural bunion deformity bilateral with elevation of the ankle bilateral

## 2017-04-20 ENCOUNTER — Other Ambulatory Visit: Payer: Managed Care, Other (non HMO) | Admitting: Orthotics

## 2017-04-23 ENCOUNTER — Encounter (HOSPITAL_BASED_OUTPATIENT_CLINIC_OR_DEPARTMENT_OTHER): Payer: Self-pay | Admitting: *Deleted

## 2017-04-23 ENCOUNTER — Emergency Department (HOSPITAL_BASED_OUTPATIENT_CLINIC_OR_DEPARTMENT_OTHER)
Admission: EM | Admit: 2017-04-23 | Discharge: 2017-04-24 | Disposition: A | Discharge status: Home / Self Care | Payer: Managed Care, Other (non HMO) | Attending: Emergency Medicine | Admitting: Emergency Medicine

## 2017-04-23 ENCOUNTER — Emergency Department (HOSPITAL_BASED_OUTPATIENT_CLINIC_OR_DEPARTMENT_OTHER): Payer: Managed Care, Other (non HMO)

## 2017-04-23 DIAGNOSIS — M79671 Pain in right foot: Secondary | ICD-10-CM

## 2017-04-23 DIAGNOSIS — J45909 Unspecified asthma, uncomplicated: Secondary | ICD-10-CM | POA: Diagnosis not present

## 2017-04-23 DIAGNOSIS — Z91010 Allergy to peanuts: Secondary | ICD-10-CM | POA: Diagnosis not present

## 2017-04-23 DIAGNOSIS — M25561 Pain in right knee: Secondary | ICD-10-CM | POA: Diagnosis not present

## 2017-04-23 DIAGNOSIS — Z79899 Other long term (current) drug therapy: Secondary | ICD-10-CM | POA: Diagnosis not present

## 2017-04-23 DIAGNOSIS — M25571 Pain in right ankle and joints of right foot: Secondary | ICD-10-CM | POA: Diagnosis not present

## 2017-04-23 MED ORDER — KETOROLAC TROMETHAMINE 60 MG/2ML IM SOLN
60.0000 mg | Freq: Once | INTRAMUSCULAR | Status: AC
  Administered 2017-04-23: 60 mg via INTRAMUSCULAR
  Filled 2017-04-23: qty 2

## 2017-04-23 MED ORDER — OXYCODONE-ACETAMINOPHEN 5-325 MG PO TABS
2.0000 | ORAL_TABLET | Freq: Once | ORAL | Status: AC
  Administered 2017-04-23: 2 via ORAL
  Filled 2017-04-23: qty 2

## 2017-04-23 NOTE — ED Notes (Signed)
Patient transported to X-ray 

## 2017-04-23 NOTE — ED Triage Notes (Signed)
Pain in her right foot and knee. She was walking down a hill and felt a pop in her arch. Pain radiated into her knee. Same pop in her left foot a year ago and she had to have tendon repair.

## 2017-04-24 MED ORDER — IBUPROFEN 600 MG PO TABS: 600.0000 mg | ORAL_TABLET | Freq: Three times a day (TID) | ORAL | 0 refills | Status: DC | PRN

## 2017-04-24 MED ORDER — OXYCODONE-ACETAMINOPHEN 5-325 MG PO TABS: 1.0000 | ORAL_TABLET | ORAL | 0 refills | Status: DC | PRN

## 2017-04-24 NOTE — ED Notes (Signed)
ED Provider at bedside discussing test results and dispo plan of care. 

## 2017-04-24 NOTE — ED Provider Notes (Addendum)
[facility]PT [facility] Provider Note   CSN: [id number] Arrival date & time: 04/23/17  2200     History   Chief Complaint Chief Complaint  Patient presents with  . Foot Pain  . Knee Pain    HPI Amanda Patrick is a 26 y.o. female.  HPI Patient has been having pain in her right foot and right ankle for several weeks and [facility] on [address].  She states she recently underwent a cortisone injection near her right medial malleolus and medial foot.  She was walking this evening when she developed sudden pain and felt like she heard a snap.  She has pain with range of motion of her right ankle since the event.  She also had pain near her right proximal fibula.  She's been unable to bear weight on the right foot and ankle since the injury without significant pain.  She immobilized her foot and ankle and came to the ER for evaluation.  She's had a prior posterior tibial tendon rupture on her left ankle which was repaired previously.  It was thought that this could've been secondary to fluoroquinolone use   Past Medical History:  Diagnosis Date  . Abnormal Pap smear   . Asthma     There are no active problems to display for this patient.   Past Surgical History:  Procedure Laterality Date  . ADENOIDECTOMY    . CESAREAN SECTION  02/07/2011   Procedure: CESAREAN SECTION;  Surgeon: Catha Brow;  Location: [facility];  Service: Gynecology;  Laterality: N/A;  Primary cesarean section with delivery of baby boy at 57.Apgars 9/9.  Marland Kitchen TENDON REPAIR    . TONSILLECTOMY AND ADENOIDECTOMY  2010    OB History    Gravida Para Term Preterm AB Living   3 1 1   2 1    SAB TAB Ectopic Multiple Live Births     2     1       Home Medications    Prior to Admission medications   Medication Sig Start Date End Date Taking? Authorizing Provider  albuterol (PROVENTIL HFA;VENTOLIN HFA) 108 (90 BASE) MCG/ACT inhaler Inhale 1 puff into the lungs  every 6 (six) hours as needed for shortness of breath.     [provider]  cetirizine (ZYRTEC) 10 MG tablet Take 10 mg by mouth daily.    [provider]  etonogestrel (NEXPLANON) 68 MG IMPL implant 1 each by Subdermal route once.    [provider]  fluticasone (FLONASE) 50 MCG/ACT nasal spray Place into both nostrils daily.    [provider]  ibuprofen (ADVIL,MOTRIN) 600 MG tablet Take 1 tablet (600 mg total) by mouth every 8 (eight) hours as needed. 04/24/17   Jola Schmidt, MD  oxyCODONE-acetaminophen (PERCOCET/ROXICET) 5-325 MG tablet Take 1 tablet by mouth every 4 (four) hours as needed for severe pain. 04/24/17   Jola Schmidt, MD  Prenatal Vit-Fe Fumarate-FA (MULTIVITAMIN-PRENATAL) 27-0.8 MG TABS tablet Take 1 tablet by mouth daily at 12 noon.    [provider]    Family History No family history on file.  Social History Social History  Substance Use Topics  . Smoking status: Never Smoker  . Smokeless tobacco: Never Used  . Alcohol use Yes     Comment: occ     Allergies   Mometasone furoate; Peanuts [nuts]; and Sulfa antibiotics   Review of Systems Review of Systems  All other systems reviewed  and are negative.    Physical Exam Updated Vital Signs BP (!) 122/52 (BP Location: Right Arm)   Pulse 73   Temp 98.4 F (36.9 C) (Oral)   Resp 18   Ht 5\' 5"  (1.651 m)   Wt 104.3 kg (230 lb)   SpO2 99%   BMI 38.27 kg/m   Physical Exam  Constitutional: She is oriented to person, place, and time. She appears well-developed and well-nourished.  HENT:  Head: Normocephalic.  Eyes: EOM are normal.  Neck: Normal range of motion.  Pulmonary/Chest: Effort normal.  Abdominal: She exhibits no distension.  Musculoskeletal:  Painful active range of motion the right ankle.  Less pain with passive range of motion.  Normal PT and DP pulse in right foot.  Compartments of the right lower extremity are soft.  Tenderness of the right medial  malleolus and tenderness along the Achilles tendon.  Neurological: She is alert and oriented to person, place, and time.  Psychiatric: She has a normal mood and affect.  Nursing note and vitals reviewed.    ED Treatments / Results  Labs (all labs ordered are listed, but only abnormal results are displayed) Labs Reviewed - No data to display  EKG  EKG Interpretation None       Radiology Dg Ankle Complete Right  Result Date: 04/24/2017 CLINICAL DATA:  Fall with pain EXAM: RIGHT ANKLE - COMPLETE 3+ VIEW COMPARISON:  None. FINDINGS: There is no evidence of fracture, dislocation, or joint effusion. There is no evidence of arthropathy or other focal bone abnormality. Soft tissues are unremarkable. IMPRESSION: Negative. Electronically Signed   By: Donavan Foil M.D.   On: 04/24/2017 00:08   Dg Knee Complete 4 Views Right  Result Date: 04/24/2017 CLINICAL DATA:  Fall with pain EXAM: RIGHT KNEE - COMPLETE 4+ VIEW COMPARISON:  None. FINDINGS: No evidence of fracture, dislocation, or joint effusion. No evidence of arthropathy or other focal bone abnormality. Soft tissues are unremarkable. IMPRESSION: Negative. Electronically Signed   By: Donavan Foil M.D.   On: 04/24/2017 00:06   Dg Foot Complete Right  Result Date: 04/24/2017 CLINICAL DATA:  Fall with pain EXAM: RIGHT FOOT COMPLETE - 3+ VIEW COMPARISON:  None. FINDINGS: There is no evidence of fracture or dislocation. There is no evidence of arthropathy or other focal bone abnormality. Soft tissues are unremarkable. IMPRESSION: Negative. Electronically Signed   By: Donavan Foil M.D.   On: 04/24/2017 00:07    Procedures .Splint Application Date/Time: 78/08/9560 12:37 AM Performed by: Jola Schmidt Authorized by: Jola Schmidt   Consent:    Consent obtained:  Verbal   Consent given by:  Patient Procedure details:    Laterality:  Right   Location:  Leg   Leg:  R lower leg   Cast type:  Short leg   Supplies:   Ortho-Glass Post-procedure details:    Pain:  Improved   Sensation:  Normal   Patient tolerance of procedure:  Tolerated well, no immediate complications   (including critical care time)  Medications Ordered in ED Medications  oxyCODONE-acetaminophen (PERCOCET/ROXICET) 5-325 MG per tablet 2 tablet (2 tablets Oral Given 04/23/17 2337)  ketorolac (TORADOL) injection 60 mg (60 mg Intramuscular Given 04/23/17 2337)     Initial Impression / Assessment and Plan / ED Course  I have reviewed the triage vital signs and the nursing notes.  Pertinent labs & imaging results that were available during my care of the patient were reviewed by me and considered in my medical decision  making (see chart for details).     Patient be placed in a nonweightbearing status.  Cam Walker.  Crutches.  She has a foot and ankle team.  She will contact them on Monday for further evaluation.  She'll likely benefit from MRI to further evaluate.  X-rays today are without acute osseous injury.  Compartments are soft.  Pulses are normal  12:37 AM Will place in posterior splint given the size of her calf which is baseline for the patient  Final Clinical Impressions(s) / ED Diagnoses   Final diagnoses:  Acute foot pain, right  Acute right ankle pain  Acute pain of right knee    New Prescriptions New Prescriptions   IBUPROFEN (ADVIL,MOTRIN) 600 MG TABLET    Take 1 tablet (600 mg total) by mouth every 8 (eight) hours as needed.   OXYCODONE-ACETAMINOPHEN (PERCOCET/ROXICET) 5-325 MG TABLET    Take 1 tablet by mouth every 4 (four) hours as needed for severe pain.     Jola Schmidt, MD 04/24/17 [facility] Pickerel    Jola Schmidt, MD 04/24/17 [phone]

## 2017-04-26 ENCOUNTER — Encounter: Payer: Self-pay | Admitting: Podiatry

## 2017-04-26 ENCOUNTER — Telehealth: Payer: Self-pay | Admitting: *Deleted

## 2017-04-26 ENCOUNTER — Ambulatory Visit (INDEPENDENT_AMBULATORY_CARE_PROVIDER_SITE_OTHER): Payer: Managed Care, Other (non HMO) | Admitting: Podiatry

## 2017-04-26 VITALS — BP 110/68 | HR 86 | Resp 16

## 2017-04-26 DIAGNOSIS — T148XXA Other injury of unspecified body region, initial encounter: Secondary | ICD-10-CM

## 2017-04-26 DIAGNOSIS — R600 Localized edema: Secondary | ICD-10-CM | POA: Diagnosis not present

## 2017-04-26 MED ORDER — TRAMADOL HCL 50 MG PO TABS: 50.0000 mg | ORAL_TABLET | Freq: Three times a day (TID) | ORAL | 2 refills | Status: DC

## 2017-04-26 NOTE — Telephone Encounter (Addendum)
Dr.Regal states he would like pt to have MRI right foot and ankle for possible right achilles tendon tear or right posterior tibial tendon tear, for surgical intervention. Sol Passer states radiology pre-cert performed by [employer] [facility]. [place] [facility] will need clinicals to pre-cert the MRI.04/27/2017-Received 04/26/2017 clinicals, faxed orders, clinicals, and demographics labeled with pt's name, DOB and Case #[id number] to [facility]. Checked pt's Appts and she is scheduled with [facility], but pre-cert was started yesterday for MRIs to be performed at [facility]. Left message for pt to call me concerning MRI. I spoke with pt and she states she scheduled with [facility] because Dr. Paulla Dolly assistant had give the phone number. I told pt that was usually who we used but Dr. Paulla Dolly wanted the MRI in [facility] for pt convenience, and possibly earlier scheduling and I told her if I changed the location of testing it may delay prior authorization. Pt states she would be fine with scheduling at [facility]. I spoke with Hoyle Sauer [facility] and she states she can schedule pt for 05/01/2017 at 1:45pm and will contact pt. Faxed orders to [facility]. 04/30/2017-[facility] [place] RIGHT ANKLE IS APPROVED ONLY AUTHORIZATION: [id number].I called authorization to [facility] scheduling and ordered the midfoot past the navicular also be included in the right ankle views. Pt wanted to know why insurance had not covered the foot, but did cover the ankle. I informed pt the insurance had stated they felt the ankle MRI would cover the structure for the diagnosis. Dr. Paulla Dolly and Dr. Jacqualyn Posey had agreed.05/10/2017-[facility] will sent copy of the MRI disc to the [place] office.05/14/2017-Copy of MRI disc mailed to [facility].

## 2017-04-26 NOTE — Progress Notes (Addendum)
Subjective:    Patient ID: Amanda Patrick, female   DOB: 26 y.o.   MRN: [id number]   HPI patient presents stating that she was going down a hill and felt a pop in the back of her ankle and then a pop on the inside of her ankle and she's not able to bear weight on her foot. She has been to the emergency room who recommended an MRI but they want [place] to see her first. Patient has had a history of a tear of her posterior tibial tendon which occurred in the acute fashion and it appears that this has occurred like the one on her left foot which was 2 years ago    ROS      Objective:  Physical Exam neurovascular status intact with weakness of the posterior tibial tendon right with very difficult to make a determination as to whether or not it may be ruptured especially in a longitudinal fashion with quite a bit of discomfort in the posterior Achilles area with the possibility for partial rupture but it does appear there is tendon intact in the posterior heel     Assessment:    Possibility for acute tear of the posterior tibial tendon right Achilles tendon right with history of tear of an acute nature posterior tibial tendon left     Plan:    H&P conditions and x-rays she brought with her reviewed. At this point I applied an Unna boot Ace wrap to try to take some of the swelling out of her foot and ankle and I want her nonweightbearing and she will get a knee scooter. She will continue to use crutches I applied an air fracture walker and I'm due to have her see Dr. Jacqualyn Posey after she has an MRI done this week which needs to be done on an emergency fashion, this is to be done for surgical consideration given the possibility and probability of rupture and a history of rupture of her left

## 2017-04-27 ENCOUNTER — Other Ambulatory Visit: Payer: Self-pay | Admitting: Podiatry

## 2017-04-27 DIAGNOSIS — T148XXA Other injury of unspecified body region, initial encounter: Secondary | ICD-10-CM

## 2017-04-30 ENCOUNTER — Ambulatory Visit: Payer: Managed Care, Other (non HMO) | Admitting: Podiatry

## 2017-05-01 ENCOUNTER — Ambulatory Visit (HOSPITAL_BASED_OUTPATIENT_CLINIC_OR_DEPARTMENT_OTHER)
Admission: RE | Admit: 2017-05-01 | Discharge: 2017-05-01 | Disposition: A | Discharge status: Home / Self Care | Payer: Managed Care, Other (non HMO) | Source: Ambulatory Visit | Attending: Podiatry | Admitting: Podiatry

## 2017-05-01 ENCOUNTER — Ambulatory Visit (HOSPITAL_BASED_OUTPATIENT_CLINIC_OR_DEPARTMENT_OTHER): Payer: Managed Care, Other (non HMO)

## 2017-05-01 DIAGNOSIS — T148XXA Other injury of unspecified body region, initial encounter: Secondary | ICD-10-CM | POA: Diagnosis not present

## 2017-05-01 DIAGNOSIS — X58XXXA Exposure to other specified factors, initial encounter: Secondary | ICD-10-CM | POA: Diagnosis not present

## 2017-05-02 ENCOUNTER — Other Ambulatory Visit: Payer: Managed Care, Other (non HMO)

## 2017-05-04 ENCOUNTER — Other Ambulatory Visit: Payer: Managed Care, Other (non HMO) | Admitting: Orthotics

## 2017-05-04 ENCOUNTER — Encounter: Payer: Self-pay | Admitting: Podiatry

## 2017-05-04 ENCOUNTER — Ambulatory Visit (INDEPENDENT_AMBULATORY_CARE_PROVIDER_SITE_OTHER): Payer: Managed Care, Other (non HMO) | Admitting: Podiatry

## 2017-05-04 DIAGNOSIS — M2141 Flat foot [pes planus] (acquired), right foot: Secondary | ICD-10-CM

## 2017-05-04 DIAGNOSIS — M779 Enthesopathy, unspecified: Secondary | ICD-10-CM

## 2017-05-04 DIAGNOSIS — M2142 Flat foot [pes planus] (acquired), left foot: Secondary | ICD-10-CM

## 2017-05-04 MED ORDER — METHYLPREDNISOLONE 4 MG PO TBPK: ORAL_TABLET | ORAL | 0 refills | Status: DC

## 2017-05-04 NOTE — Progress Notes (Signed)
Subjective: Ms. Genevieve Norlander the office today for follow-up evaluation of right ankle pain and he discuss MRI results. She had a MRI because she was going down a hill and she felt a pop in the back of her ankle as well as inside aspect of her ankle. She states that since last appointment the pain has gone down but she still has some discomfort in the swelling as much in area she does continue to wear the CAM boot and she does use one crutch to help take pressure off of the foot. She denies any recent injury or trauma since last appointment. She is a history of a rupture of the posterior tibial tendon on the left foot due to overuse. She states that this is the least swollen her right ankle is been in some time. Denies any systemic complaints such as fevers, chills, nausea, vomiting. No acute changes since last appointment, and no other complaints at this time.   Objective: AAO x3, NAD DP/PT pulses palpable bilaterally, CRT less than 3 seconds Dictated there is tenderness on the course of posterior tibial tendon, flexor tendons on the medial aspect of the ankle just posterior to the medial malleolus and inferior along the insertion into the navicular tuberosity as well. There is no cystic in edema to this area today there is no erythema or increase in warmth. There is also some minimal discomfort on the course the ATFL the peroneal tendon however there is no gross ankle instability present. There is no pain along the Achilles tendon and the Achilles tendon appears to be intact and Thompson test is negative.no pain on the plantar fashion upon her fascia appears to be intact today. MMT appears to be 5/5 today and tendons all appear to be intact including the posterior tibial tendon. No open lesions or pre-ulcerative lesions.  No pain with calf compression, swelling, warmth, erythema  ------ MRI 05/01/2017 right ankle: IMPRESSION: Normal MRI right ankle. No finding to explain the  patient's symptoms. -------  Assessment: Tendinitis right foot; chronic flatfoot  Plan: -All treatment options discussed with the patient including all alternatives, risks, complications.  -MRI results were discussed the patient. See above. We'll obtain aMRI for second opinion, over read. -Medrol Dosepak was prescribed. -Will start physical therapy. Prescription was provided today for impingement physical therapy -Recommend her to continuing the CAM boot but as she starts physical therapy she can transition out of the boot into regular shoe. -at this point ongoing proceed with custom molded orthotic to help support her foot and she agrees to getting this done. She has been using over-the-counter inserts but they wear out. She was molded for orthotics today.  -Patient encouraged to call the office with any questions, concerns, change in symptoms.   Celesta Gentile, DPM

## 2017-05-05 ENCOUNTER — Telehealth: Payer: Self-pay | Admitting: Podiatry

## 2017-05-05 NOTE — Telephone Encounter (Signed)
I am calling because I saw Dr. Jacqualyn Posey yesterday while Dr. Paulla Dolly is out. I wanted to speak to someone about my plans and outcome regarding for surgery and physical therapy. I went to physical therapy today and something came up. I want to discuss my treatment plan with someone as soon as possible because I don't think physical therapy is going to be an option. You can reach me at [phone].

## 2017-05-06 NOTE — Telephone Encounter (Signed)
I do not recommed surgery for a flatfoot at this point. I would try the orthtoics first. If she wants another opinion about surgery then have her see Dr. March Rummage but I would hold off on it for now. She has tried one PT session and not received the orthotics yet. Also, just sent off for an over read of the MRI.

## 2017-05-06 NOTE — Telephone Encounter (Signed)
Pt states she went to PT yesterday and was told that she would need to pay $75.00/visit and they wanted to see her 2 times a week, which would be $600.00/month. Pt states her co-pay for surgery would be $400.00 - $500.00. Pt states she was only doing PT to hold off surgery, financially she doesn't think PT is going to work for her.

## 2017-05-07 ENCOUNTER — Ambulatory Visit: Payer: Self-pay | Admitting: Allergy

## 2017-05-07 NOTE — Telephone Encounter (Signed)
I informed pt of Dr. Leigh Aurora 05/06/2017 12:34pm, and pt states understanding. Pt states understanding. Ordered copy of MRI disc from [place].

## 2017-05-11 ENCOUNTER — Other Ambulatory Visit (HOSPITAL_COMMUNITY)
Admission: RE | Admit: 2017-05-11 | Discharge: 2017-05-11 | Disposition: A | Discharge status: Home / Self Care | Payer: Managed Care, Other (non HMO) | Source: Ambulatory Visit | Attending: Obstetrics and Gynecology | Admitting: Obstetrics and Gynecology

## 2017-05-11 ENCOUNTER — Other Ambulatory Visit: Payer: Self-pay | Admitting: Nurse Practitioner

## 2017-05-11 DIAGNOSIS — Z01419 Encounter for gynecological examination (general) (routine) without abnormal findings: Secondary | ICD-10-CM | POA: Insufficient documentation

## 2017-05-12 LAB — CYTOLOGY - PAP: DIAGNOSIS: NEGATIVE

## 2017-05-25 ENCOUNTER — Ambulatory Visit: Payer: Managed Care, Other (non HMO) | Admitting: Podiatry

## 2017-06-03 ENCOUNTER — Ambulatory Visit: Payer: Managed Care, Other (non HMO) | Admitting: Podiatry

## 2017-06-21 ENCOUNTER — Encounter: Payer: Managed Care, Other (non HMO) | Admitting: Orthotics

## 2017-06-23 ENCOUNTER — Encounter: Payer: Managed Care, Other (non HMO) | Admitting: Orthotics

## 2017-06-25 ENCOUNTER — Other Ambulatory Visit: Payer: Self-pay | Admitting: Surgery

## 2017-06-28 ENCOUNTER — Encounter: Payer: Managed Care, Other (non HMO) | Admitting: Orthotics

## 2017-06-30 ENCOUNTER — Encounter: Payer: Managed Care, Other (non HMO) | Admitting: Orthotics

## 2017-07-09 NOTE — Pre-Procedure Instructions (Signed)
[facility]  07/09/2017      [id number] [address], [place] - [place] [address] Phone: [phone] Fax: [phone]    Your procedure is scheduled on July 15, 2017.  Report to [facility] Admitting at 1200 PM.  Call this number if you have problems the morning of surgery:  [phone]   Remember:  Do not eat food or drink liquids after midnight.  Take these medicines the morning of surgery with A SIP OF WATER acetaminophen (tylenol)-if needed, albuterol inhaler (bring inhaler with you), cetirizine (zyrtec)-if needed, restasis eye drops, flonase nasal spray-if needed   Do not wear jewelry, make-up or nail polish.  Do not wear lotions, powders, or perfumes, or deodorant.  Do not shave 48 hours prior to surgery.    Do not bring valuables to the hospital.  [facility] is not responsible for any belongings or valuables.  Contacts, dentures or bridgework may not be worn into surgery.  Leave your suitcase in the car.  After surgery it may be brought to your room.  For patients admitted to the hospital, discharge time will be determined by your treatment team.  Patients discharged the day of surgery will not be allowed to drive home.   Special instructions:   [facility]- Preparing For Surgery  Before surgery, you can play an important role. Because skin is not sterile, your skin needs to be as free of germs as possible. You can reduce the number of germs on your skin by washing with CHG (chlorahexidine gluconate) Soap before surgery.  CHG is an antiseptic cleaner which kills germs and bonds with the skin to continue killing germs even after washing.  Please do not use if you have an allergy to CHG or antibacterial soaps. If your skin becomes reddened/irritated stop using the CHG.  Do not shave (including legs and underarms) for at least 48 hours prior to first CHG shower. It is OK to shave your face.  Please  follow these instructions carefully.   1. Shower the NIGHT BEFORE SURGERY and the MORNING OF SURGERY with CHG.   2. If you chose to wash your hair, wash your hair first as usual with your normal shampoo.  3. After you shampoo, rinse your hair and body thoroughly to remove the shampoo.  4. Use CHG as you would any other liquid soap. You can apply CHG directly to the skin and wash gently with a scrungie or a clean washcloth.   5. Apply the CHG Soap to your body ONLY FROM THE NECK DOWN.  Do not use on open wounds or open sores. Avoid contact with your eyes, ears, mouth and genitals (private parts). Wash Face and genitals (private parts)  with your normal soap.  6. Wash thoroughly, paying special attention to the area where your surgery will be performed.  7. Thoroughly rinse your body with warm water from the neck down.  8. DO NOT shower/wash with your normal soap after using and rinsing off the CHG Soap.  9. Pat yourself dry with a CLEAN TOWEL.  10. Wear CLEAN PAJAMAS to bed the night before surgery, wear comfortable clothes the morning of surgery  11. Place CLEAN SHEETS on your bed the night of your first shower and DO NOT SLEEP WITH PETS.  Day of Surgery: Do not apply any deodorants/lotions. Please wear clean clothes to the hospital/surgery center.    Please read over the following fact sheets that you were  given. Pain Booklet, Coughing and Deep Breathing and Surgical Site Infection Prevention

## 2017-07-12 ENCOUNTER — Encounter (HOSPITAL_COMMUNITY): Payer: Self-pay

## 2017-07-12 ENCOUNTER — Other Ambulatory Visit: Payer: Self-pay

## 2017-07-12 ENCOUNTER — Encounter (HOSPITAL_COMMUNITY)
Admission: RE | Admit: 2017-07-12 | Discharge: 2017-07-12 | Disposition: A | Discharge status: Home / Self Care | Payer: Managed Care, Other (non HMO) | Source: Ambulatory Visit | Attending: Surgery | Admitting: Surgery

## 2017-07-12 DIAGNOSIS — Z79899 Other long term (current) drug therapy: Secondary | ICD-10-CM | POA: Diagnosis not present

## 2017-07-12 DIAGNOSIS — Z91010 Allergy to peanuts: Secondary | ICD-10-CM | POA: Diagnosis not present

## 2017-07-12 DIAGNOSIS — Z881 Allergy status to other antibiotic agents status: Secondary | ICD-10-CM | POA: Diagnosis not present

## 2017-07-12 DIAGNOSIS — M1990 Unspecified osteoarthritis, unspecified site: Secondary | ICD-10-CM | POA: Diagnosis not present

## 2017-07-12 DIAGNOSIS — J45909 Unspecified asthma, uncomplicated: Secondary | ICD-10-CM | POA: Diagnosis not present

## 2017-07-12 DIAGNOSIS — L0102 Bockhart's impetigo: Secondary | ICD-10-CM | POA: Diagnosis not present

## 2017-07-12 DIAGNOSIS — Z888 Allergy status to other drugs, medicaments and biological substances status: Secondary | ICD-10-CM | POA: Diagnosis not present

## 2017-07-12 DIAGNOSIS — E669 Obesity, unspecified: Secondary | ICD-10-CM | POA: Diagnosis not present

## 2017-07-12 DIAGNOSIS — R220 Localized swelling, mass and lump, head: Secondary | ICD-10-CM | POA: Diagnosis present

## 2017-07-12 DIAGNOSIS — Z882 Allergy status to sulfonamides status: Secondary | ICD-10-CM | POA: Diagnosis not present

## 2017-07-12 DIAGNOSIS — Z6835 Body mass index (BMI) 35.0-35.9, adult: Secondary | ICD-10-CM | POA: Diagnosis not present

## 2017-07-12 DIAGNOSIS — K219 Gastro-esophageal reflux disease without esophagitis: Secondary | ICD-10-CM | POA: Diagnosis not present

## 2017-07-12 HISTORY — DX: Gastro-esophageal reflux disease without esophagitis: K21.9

## 2017-07-12 HISTORY — DX: Dry eye syndrome of unspecified lacrimal gland: H04.129

## 2017-07-12 HISTORY — DX: Anxiety disorder, unspecified: F41.9

## 2017-07-12 HISTORY — DX: Cardiac arrhythmia, unspecified: I49.9

## 2017-07-12 HISTORY — DX: Unspecified hearing loss, unspecified ear: H91.90

## 2017-07-12 HISTORY — DX: Unspecified osteoarthritis, unspecified site: M19.90

## 2017-07-12 HISTORY — DX: Headache: R51

## 2017-07-12 HISTORY — DX: Headache, unspecified: R51.9

## 2017-07-12 LAB — CBC
HCT: 37.4 % (ref 36.0–46.0)
Hemoglobin: 12.3 g/dL (ref 12.0–15.0)
MCH: 32.5 pg (ref 26.0–34.0)
MCHC: 32.9 g/dL (ref 30.0–36.0)
MCV: 98.7 fL (ref 78.0–100.0)
PLATELETS: 231 10*3/uL (ref 150–400)
RBC: 3.79 MIL/uL — AB (ref 3.87–5.11)
RDW: 13.4 % (ref 11.5–15.5)
WBC: 5.1 10*3/uL (ref 4.0–10.5)

## 2017-07-12 LAB — BASIC METABOLIC PANEL
Anion gap: 5 (ref 5–15)
BUN: 11 mg/dL (ref 6–20)
CO2: 25 mmol/L (ref 22–32)
Calcium: 9 mg/dL (ref 8.9–10.3)
Chloride: 109 mmol/L (ref 101–111)
Creatinine, Ser: 0.91 mg/dL (ref 0.44–1.00)
Glucose, Bld: 111 mg/dL — ABNORMAL HIGH (ref 65–99)
POTASSIUM: 4.2 mmol/L (ref 3.5–5.1)
SODIUM: 139 mmol/L (ref 135–145)

## 2017-07-12 LAB — HCG, SERUM, QUALITATIVE: PREG SERUM: NEGATIVE

## 2017-07-12 NOTE — Progress Notes (Addendum)
Patient states she saw Cardio in [place] back in Jan. 2018.  [facility].  She went to see them for 'butterflies and I would lose my breath"  Did wear a 48 hr holter monitor - showing PAC's. She states she's been under a lot of stress lately. PCP is Dr. Deforest Hoyles @ [place], LOV 05/2017 Denies murmur, cp, sob currently A Zelenak, PA has come to interview patient.

## 2017-07-12 NOTE — Pre-Procedure Instructions (Signed)
[facility]  07/12/2017      [id number] - [address], [place] [address] Phone: [phone] Fax: [phone]    Your procedure is scheduled on Thursday, July 15, 2017.   Report to [facility] Admitting at 1200 PM.             (posted surgery time 2:00 pm - 2:34 pm)   Call this number if you have problems the morning of surgery:  [phone]   Remember:   Do not eat food or drink liquids after midnight, Wednesday.   Take these medicines the morning of surgery with A SIP OF WATER acetaminophen (tylenol)-if needed, albuterol inhaler (bring inhaler with you), cetirizine (zyrtec)-if needed, restasis eye drops, flonase nasal spray-if needed   Do not wear jewelry, make-up or nail polish.  Do not wear lotions, powders, perfumes, or deodorant.  Do not shave 48 hours prior to surgery.     Do not bring valuables to the hospital.  [facility] is not responsible for any belongings or valuables.  Contacts, dentures or bridgework may not be worn into surgery.  Leave your suitcase in the car.  After surgery it may be brought to your room.  Patients discharged the day of surgery will not be allowed to drive home, and will need someone to stay with you for the first 24 yrs.              Special instructions:   [facility]- Preparing For Surgery  Before surgery, you can play an important role. Because skin is not sterile, your skin needs to be as free of germs as possible. You can reduce the number of germs on your skin by washing with CHG (chlorahexidine gluconate) Soap before surgery.  CHG is an antiseptic cleaner which kills germs and bonds with the skin to continue killing germs even after washing.  Please do not use if you have an allergy to CHG or antibacterial soaps. If your skin becomes reddened/irritated stop using the CHG.  Do not shave (including legs and underarms) for at least 48 hours  prior to first CHG shower. It is OK to shave your face.  Please follow these instructions carefully.   1. Shower the NIGHT BEFORE SURGERY and the MORNING OF SURGERY with CHG.   2. If you chose to wash your hair, wash your hair first as usual with your normal shampoo.  3. After you shampoo, rinse your hair and body thoroughly to remove the shampoo.  4. Use CHG as you would any other liquid soap. You can apply CHG directly to the skin and wash gently with a scrungie or a clean washcloth.   5. Apply the CHG Soap to your body ONLY FROM THE NECK DOWN.  Do not use on open wounds or open sores. Avoid contact with your eyes, ears, mouth and genitals (private parts). Wash Face and genitals (private parts)  with your normal soap.  6. Wash thoroughly, paying special attention to the area where your surgery will be performed.  7. Thoroughly rinse your body with warm water from the neck down.  8. DO NOT shower/wash with your normal soap after using and rinsing off the CHG Soap.  9. Pat yourself dry with a CLEAN TOWEL.  10. Wear CLEAN PAJAMAS to bed the night before surgery, wear comfortable clothes the morning of surgery  11. Place CLEAN SHEETS on your bed the night of your first shower and DO  NOT SLEEP WITH PETS.  Day of Surgery: Do not apply any deodorants/lotions. Please wear clean clothes to the hospital/surgery center.    Please read over the following fact sheets that you were given. Pain Booklet, Coughing and Deep Breathing and Surgical Site Infection Prevention

## 2017-07-12 NOTE — Progress Notes (Signed)
Anesthesia PAT Evaluation: Patient is a 26 year old female scheduled for excision of scalp mass on 07/15/17 by Dr. Coralie Keens. Patient reports she has been told the mass is thought to either be a lipoma or cyst. It is located at the crown of her head and has been increasing in size. Anesthesia type is posted for [place].  History includes never smoker, asthma, anxiety, palpitations (chest "fluttering"), GERD, migraines, RA, T&A '10, c-section 02/07/11. She reports her blood glucose was "borderline" high in the past by her GYN Sadie Haber), but has not been diagnosed with diabetes.   - PCP is Dr. [name] with [place] is with [facility]umat[facility]. She has opted not to start meds for RA at this time. - She saw cardiologist Dr. Vic Blackbird with [facility] on 07/17/16 for evaluation of palpitations. She wore a 48 hour Holter monitor with symptoms correlating with SR. As on 07/31/16, no further cardiac work-up recommended with PRN cardiology follow-up.  Meds include albuterol HFA, Zyrtec, Nexplanon implant, Flonase, Culturelle, Prenatal vitamin, omega fish oil.   BP 123/75   Pulse 88   Temp 36.7 C   Resp 20   Ht 5\' 5"  (1.651 m)   Wt 216 lb (98 kg)   LMP 01/07/2017   SpO2 97%   BMI 35.94 kg/m   Heart RRR, no murmur noted. Lungs clear. No ankle edema. Mallampati II.  Patient reports that for the past 1-2 weeks she has had episodes when she awakens feeling restless or panicked, and can feel "weird" in her chest. Has been feeling stressed, but not necessarily worse then other times in her life. She denies exertional chest pain. No syncope. No SOB. No swelling. Reports thyroid studies okay with physical within the past year. No known OSA history. Denied new medications. No cardiac testing other than EKGs, event monitor.   EKG 07/12/17: NSR with sinus arrhythmia.  According to Dr. Deloria Lair' 07/31/16 office note, 07/2016 48 hour Holter monitor revealed  episodes of fluttering and shortness of breath correlating with sinus rhythm. She did have occasional to frequent asymptomatic PACs.  CTA chest 05/27/16 ([place]): FINDINGS: - Pulmonary arteries are adequately opacified without acute or chronic filling defects.The thoracic aorta is normal in course and caliber without dissection or aneurysm. - The heart is normal in size without pericardial effusion.Thoracic lymph nodes are not enlarged. - There is no pleural effusion, pleural thickening, or pneumothorax.The airways are patent. - Lungs are clear without consolidation, interstitial disease, or suspicious nodules. - Upper abdomen demonstrates no acute pathology. - There are no acute fractures.No suspicious bony lesions.  - Impression: No evidence of pulmonary embolism.  CXR 09/15/16 ([place]): FINDINGS: The cardiomediastinal silhouette is normal in size.There is no consolidation or atelectasis in either lung.There are no pleural effusions.There is no pneumothorax.No acute osseous process. Impression: No acute disease.  Preoperative labs noted. Glucose 111. Cr 0.91. H/H 12.3/37.4. PLT 231K. Serum pregnancy test is negative.   Patient having episodes of awakening at night feeling panicked that can be associated with a "weird" feeling in her chest. No exertional symptoms. No syncope. EKG unremarkable (sinus arrhythmia). Holter monitor earlier this year was normal with PACs. Possible anxiety attack, but reports no formal diagnosis. Based on symptoms described and previous testing would anticipate that she can proceed as planned if no acute or progressive symptoms. Encouraged her to seek further evaluation if symptoms persisted.  George Hugh [facility] Short Stay Center/Anesthesiology Phone [phone] 07/12/2017  3:14 PM

## 2017-07-14 NOTE — H&P (Signed)
[place] 06/25/2017 10:25 AM Location: [facility] Patient #: [id number] DOB: [birth year] Single / Language: Cleophus Molt / Race: Black or African American Female   History of Present Illness (Sheniya Garciaperez A. Ninfa Linden MD; 06/25/2017 10:50 AM) The patient is a 26 year old female who presents with a complaint of Mass. This is a pleasant patient referred by Dr. [name] for evaluation of a scalp mass. The patient reports that the mesentery present for over a year but is now over the last several months getting larger and causing pain. She had a workup in [place] and had a scalp mass removed and then her job transferred her to the [facility] area. She has had no drainage from the area. The masses wasn't focal headaches and focal tenderness. She is otherwise without complaints.   Past Surgical History Alean Rinne, [place]; 06/25/2017 10:35 AM) Cesarean Section - 1  Foot Surgery  Left. Oral Surgery  Tonsillectomy   Diagnostic Studies History Alean Rinne, [place]; 06/25/2017 10:35 AM) Colonoscopy  never Mammogram  never Pap Smear  1-5 years ago  Allergies Alean Rinne, RMA; 06/25/2017 10:37 AM) No Known Drug Allergies 06/25/2017 (Marked as Inactive) PEANUTS  Anaphylaxis, Swelling. Sulfa Antibiotics  Hives.  Medication History Alean Rinne, [place]; 06/25/2017 10:39 AM) Restasis Multidose (0.05% Emulsion, Ophthalmic) Active. ZyrTEC (10MG  Tablet, Oral) Active. Nexplanon (68MG  Implant, Subcutaneous) Active. Advil (200MG  Tablet, Oral) Active. Probiotic Daily (Oral) Active. Fish Oil (1000MG  Capsule, Oral) Active. Medications Reconciled  Social History Alean Rinne, [place]; 06/25/2017 10:35 AM) Alcohol use  Occasional alcohol use. No drug use  Tobacco use  Former smoker.  Family History Alean Rinne, [place]; 06/25/2017 10:35 AM) Alcohol Abuse  Father. Arthritis  Father, Mother. Breast Cancer  Family Members In General. Colon Cancer   Family Members In General. Colon Polyps  Family Members In General. Hypertension  Father. Malignant Neoplasm Of Pancreas  Family Members In General. Prostate Cancer  Family Members In General. Thyroid problems  Mother.  Pregnancy / Birth History Alean Rinne, [place]; 06/25/2017 10:35 AM) Age at menarche  67 years. Contraceptive History  Contraceptive implant. Gravida  3 Irregular periods  Length (months) of breastfeeding  3-6 Maternal age  3-20 Para  1  Other Problems Alean Rinne, [place]; 06/25/2017 10:35 AM) Asthma  Other disease, cancer, significant illness     Review of Systems Alean Rinne RMA; 06/25/2017 10:35 AM) General Not Present- Appetite Loss, Chills, Fatigue, Fever, Night Sweats, Weight Gain and Weight Loss. Skin Not Present- Change in Wart/Mole, Dryness, Hives, Jaundice, New Lesions, Non-Healing Wounds, Rash and Ulcer. HEENT Present- Hearing Loss, Seasonal Allergies and Wears glasses/contact lenses. Not Present- Earache, Hoarseness, Nose Bleed, Oral Ulcers, Ringing in the Ears, Sinus Pain, Sore Throat, Visual Disturbances and Yellow Eyes. Respiratory Not Present- Bloody sputum, Chronic Cough, Difficulty Breathing, Snoring and Wheezing. Cardiovascular Present- Palpitations. Not Present- Chest Pain, Difficulty Breathing Lying Down, Leg Cramps, Rapid Heart Rate, Shortness of Breath and Swelling of Extremities. Gastrointestinal Not Present- Abdominal Pain, Bloating, Bloody Stool, Change in Bowel Habits, Chronic diarrhea, Constipation, Difficulty Swallowing, Excessive gas, Gets full quickly at meals, Hemorrhoids, Indigestion, Nausea, Rectal Pain and Vomiting. Female Genitourinary Not Present- Frequency, Nocturia, Painful Urination, Pelvic Pain and Urgency. Musculoskeletal Present- Back Pain, Joint Pain, Joint Stiffness, Muscle Pain, Muscle Weakness and Swelling of Extremities. Neurological Present- Headaches. Not Present- Decreased Memory, Fainting, Numbness,  Seizures, Tingling, Tremor, Trouble walking and Weakness. Psychiatric Not Present- Anxiety, Bipolar, Change in Sleep Pattern, Depression, Fearful and Frequent crying. Endocrine Not Present- Cold  Intolerance, Excessive Hunger, Hair Changes, Heat Intolerance, Hot flashes and New Diabetes. Hematology Not Present- Blood Thinners, Easy Bruising, Excessive bleeding, Gland problems, HIV and Persistent Infections.  Vitals Alean Rinne RMA; 06/25/2017 10:36 AM) 06/25/2017 10:36 AM Weight: 221 lb Temp.: 98.62F  Pulse: 97 (Regular)  P.OX: 97% (Room air) BP: 116/78 (Sitting, Left Arm, Standard)       Physical Exam (Harlynn Kimbell A. Ninfa Linden MD; 06/25/2017 10:51 AM) The physical exam findings are as follows: Note:On exam, her lungs are clear Cardiovascular regular rate and rhythm There is a 2.5 cm soft, tender mass on the top of her scalp. There are no skin changes. Lungs clear CV RRR abd soft, NT Skin without erythema    Assessment & Plan (Cameren Odwyer A. Ninfa Linden MD; 06/25/2017 10:51 AM) SCALP MASS (R22.0) Impression: Because the mass is getting larger and is symptomatic, surgical excision is recommended. I suspect this is either a sebaceous cyst or some kind of lipoma or dermoid tumor. Histologic evaluation is recommended. We discussed procedure in detail. I discussed the risk which includes but is not limited to bleeding, infection, cardiopulmonary issues, recurrence, etc. We also discussed postoperative recovery. She understands and wishes to proceed with surgery

## 2017-07-15 ENCOUNTER — Ambulatory Visit (HOSPITAL_COMMUNITY): Payer: Managed Care, Other (non HMO) | Admitting: Vascular Surgery

## 2017-07-15 ENCOUNTER — Other Ambulatory Visit: Payer: Self-pay

## 2017-07-15 ENCOUNTER — Encounter (HOSPITAL_COMMUNITY)
Admission: RE | Disposition: A | Discharge status: Home / Self Care | Payer: Self-pay | Source: Ambulatory Visit | Attending: Surgery

## 2017-07-15 ENCOUNTER — Encounter (HOSPITAL_COMMUNITY): Payer: Self-pay | Admitting: Certified Registered Nurse Anesthetist

## 2017-07-15 ENCOUNTER — Ambulatory Visit (HOSPITAL_COMMUNITY): Payer: Managed Care, Other (non HMO) | Admitting: Certified Registered Nurse Anesthetist

## 2017-07-15 ENCOUNTER — Ambulatory Visit (HOSPITAL_COMMUNITY)
Admission: RE | Admit: 2017-07-15 | Discharge: 2017-07-15 | Disposition: A | Discharge status: Home / Self Care | Payer: Managed Care, Other (non HMO) | Source: Ambulatory Visit | Attending: Surgery | Admitting: Surgery

## 2017-07-15 DIAGNOSIS — M1990 Unspecified osteoarthritis, unspecified site: Secondary | ICD-10-CM | POA: Insufficient documentation

## 2017-07-15 DIAGNOSIS — L0102 Bockhart's impetigo: Secondary | ICD-10-CM | POA: Insufficient documentation

## 2017-07-15 DIAGNOSIS — Z6835 Body mass index (BMI) 35.0-35.9, adult: Secondary | ICD-10-CM | POA: Insufficient documentation

## 2017-07-15 DIAGNOSIS — Z79899 Other long term (current) drug therapy: Secondary | ICD-10-CM | POA: Insufficient documentation

## 2017-07-15 DIAGNOSIS — Z888 Allergy status to other drugs, medicaments and biological substances status: Secondary | ICD-10-CM | POA: Insufficient documentation

## 2017-07-15 DIAGNOSIS — K219 Gastro-esophageal reflux disease without esophagitis: Secondary | ICD-10-CM | POA: Insufficient documentation

## 2017-07-15 DIAGNOSIS — Z881 Allergy status to other antibiotic agents status: Secondary | ICD-10-CM | POA: Insufficient documentation

## 2017-07-15 DIAGNOSIS — Z882 Allergy status to sulfonamides status: Secondary | ICD-10-CM | POA: Insufficient documentation

## 2017-07-15 DIAGNOSIS — Z91010 Allergy to peanuts: Secondary | ICD-10-CM | POA: Insufficient documentation

## 2017-07-15 DIAGNOSIS — J45909 Unspecified asthma, uncomplicated: Secondary | ICD-10-CM | POA: Insufficient documentation

## 2017-07-15 DIAGNOSIS — E669 Obesity, unspecified: Secondary | ICD-10-CM | POA: Insufficient documentation

## 2017-07-15 HISTORY — DX: Localized swelling, mass and lump, head: R22.0

## 2017-07-15 HISTORY — PX: MASS EXCISION: SHX2000

## 2017-07-15 SURGERY — EXCISION MASS
Anesthesia: Monitor Anesthesia Care | Site: Head

## 2017-07-15 MED ORDER — CHLORHEXIDINE GLUCONATE CLOTH 2 % EX PADS: 6.0000 | MEDICATED_PAD | Freq: Once | CUTANEOUS | Status: DC

## 2017-07-15 MED ORDER — MIDAZOLAM HCL 2 MG/2ML IJ SOLN
INTRAMUSCULAR | Status: AC
  Filled 2017-07-15: qty 2

## 2017-07-15 MED ORDER — 0.9 % SODIUM CHLORIDE (POUR BTL) OPTIME
TOPICAL | Status: DC | PRN
  Administered 2017-07-15: 1000 mL

## 2017-07-15 MED ORDER — MEPERIDINE HCL 25 MG/ML IJ SOLN: 6.2500 mg | INTRAMUSCULAR | Status: DC | PRN

## 2017-07-15 MED ORDER — MORPHINE SULFATE (PF) 2 MG/ML IV SOLN: 2.0000 mg | INTRAVENOUS | Status: DC | PRN

## 2017-07-15 MED ORDER — FENTANYL CITRATE (PF) 100 MCG/2ML IJ SOLN: 25.0000 ug | INTRAMUSCULAR | Status: DC | PRN

## 2017-07-15 MED ORDER — CEFAZOLIN SODIUM-DEXTROSE 2-4 GM/100ML-% IV SOLN
2.0000 g | INTRAVENOUS | Status: AC
  Administered 2017-07-15: 2 g via INTRAVENOUS
  Filled 2017-07-15: qty 100

## 2017-07-15 MED ORDER — ACETAMINOPHEN 650 MG RE SUPP: 650.0000 mg | RECTAL | Status: DC | PRN

## 2017-07-15 MED ORDER — SODIUM CHLORIDE 0.9% FLUSH: 3.0000 mL | INTRAVENOUS | Status: DC | PRN

## 2017-07-15 MED ORDER — PROPOFOL 10 MG/ML IV BOLUS
INTRAVENOUS | Status: AC
  Filled 2017-07-15: qty 20

## 2017-07-15 MED ORDER — OXYCODONE HCL 5 MG PO TABS: 5.0000 mg | ORAL_TABLET | Freq: Four times a day (QID) | ORAL | 0 refills | Status: DC | PRN

## 2017-07-15 MED ORDER — HYDROCODONE-ACETAMINOPHEN 7.5-325 MG PO TABS
1.0000 | ORAL_TABLET | Freq: Once | ORAL | Status: AC | PRN
  Administered 2017-07-15: 1 via ORAL

## 2017-07-15 MED ORDER — MIDAZOLAM HCL 5 MG/5ML IJ SOLN
INTRAMUSCULAR | Status: DC | PRN
  Administered 2017-07-15: 2 mg via INTRAVENOUS

## 2017-07-15 MED ORDER — ONDANSETRON HCL 4 MG/2ML IJ SOLN
INTRAMUSCULAR | Status: AC
  Filled 2017-07-15: qty 2

## 2017-07-15 MED ORDER — FENTANYL CITRATE (PF) 100 MCG/2ML IJ SOLN
INTRAMUSCULAR | Status: DC | PRN
  Administered 2017-07-15: 25 ug via INTRAVENOUS

## 2017-07-15 MED ORDER — PROPOFOL 1000 MG/100ML IV EMUL
INTRAVENOUS | Status: AC
  Filled 2017-07-15: qty 100

## 2017-07-15 MED ORDER — FENTANYL CITRATE (PF) 250 MCG/5ML IJ SOLN
INTRAMUSCULAR | Status: AC
  Filled 2017-07-15: qty 5

## 2017-07-15 MED ORDER — LIDOCAINE-EPINEPHRINE 1 %-1:100000 IJ SOLN
INTRAMUSCULAR | Status: DC | PRN
  Administered 2017-07-15: 2 mL

## 2017-07-15 MED ORDER — METOCLOPRAMIDE HCL 5 MG/ML IJ SOLN: 10.0000 mg | Freq: Once | INTRAMUSCULAR | Status: DC | PRN

## 2017-07-15 MED ORDER — PROPOFOL 500 MG/50ML IV EMUL
INTRAVENOUS | Status: DC | PRN
  Administered 2017-07-15: 100 ug/kg/min via INTRAVENOUS

## 2017-07-15 MED ORDER — ONDANSETRON HCL 4 MG/2ML IJ SOLN
INTRAMUSCULAR | Status: DC | PRN
  Administered 2017-07-15: 4 mg via INTRAVENOUS

## 2017-07-15 MED ORDER — LACTATED RINGERS IV SOLN
INTRAVENOUS | Status: DC
Start: 2017-07-15 — End: 2017-07-15
  Administered 2017-07-15 (×2): via INTRAVENOUS

## 2017-07-15 MED ORDER — LIDOCAINE-EPINEPHRINE 1 %-1:100000 IJ SOLN
INTRAMUSCULAR | Status: AC
  Filled 2017-07-15: qty 1

## 2017-07-15 MED ORDER — BACITRACIN-NEOMYCIN-POLYMYXIN 400-5-5000 EX OINT
TOPICAL_OINTMENT | CUTANEOUS | Status: AC
  Filled 2017-07-15: qty 1

## 2017-07-15 MED ORDER — BUPIVACAINE-EPINEPHRINE (PF) 0.25% -1:200000 IJ SOLN
INTRAMUSCULAR | Status: AC
  Filled 2017-07-15: qty 30

## 2017-07-15 MED ORDER — BUPIVACAINE-EPINEPHRINE 0.25% -1:200000 IJ SOLN
INTRAMUSCULAR | Status: DC | PRN
  Administered 2017-07-15: 5 mL

## 2017-07-15 MED ORDER — HYDROCODONE-ACETAMINOPHEN 7.5-325 MG PO TABS
ORAL_TABLET | ORAL | Status: AC
  Filled 2017-07-15: qty 1

## 2017-07-15 MED ORDER — LIDOCAINE-EPINEPHRINE 2 %-1:100000 IJ SOLN
INTRAMUSCULAR | Status: AC
  Filled 2017-07-15: qty 1

## 2017-07-15 MED ORDER — LIDOCAINE 2% (20 MG/ML) 5 ML SYRINGE
INTRAMUSCULAR | Status: AC
  Filled 2017-07-15: qty 5

## 2017-07-15 MED ORDER — BACITRACIN-NEOMYCIN-POLYMYXIN 400-5-5000 EX OINT
TOPICAL_OINTMENT | CUTANEOUS | Status: DC | PRN
  Administered 2017-07-15: 1 via TOPICAL

## 2017-07-15 MED ORDER — SODIUM CHLORIDE 0.9% FLUSH: 3.0000 mL | Freq: Two times a day (BID) | INTRAVENOUS | Status: DC

## 2017-07-15 MED ORDER — SODIUM CHLORIDE 0.9 % IV SOLN: 250.0000 mL | INTRAVENOUS | Status: DC | PRN

## 2017-07-15 MED ORDER — GABAPENTIN 300 MG PO CAPS
300.0000 mg | ORAL_CAPSULE | ORAL | Status: AC
  Administered 2017-07-15: 300 mg via ORAL
  Filled 2017-07-15: qty 1

## 2017-07-15 MED ORDER — ACETAMINOPHEN 325 MG PO TABS: 650.0000 mg | ORAL_TABLET | ORAL | Status: DC | PRN

## 2017-07-15 MED ORDER — OXYCODONE HCL 5 MG PO TABS: 5.0000 mg | ORAL_TABLET | ORAL | Status: DC | PRN

## 2017-07-15 MED ORDER — ACETAMINOPHEN 500 MG PO TABS
1000.0000 mg | ORAL_TABLET | ORAL | Status: AC
  Administered 2017-07-15: 1000 mg via ORAL
  Filled 2017-07-15: qty 2

## 2017-07-15 SURGICAL SUPPLY — 41 items
BLADE SURG 10 STRL SS (BLADE) ×2 IMPLANT
BLADE SURG 15 STRL LF DISP TIS (BLADE) ×1 IMPLANT
BLADE SURG 15 STRL SS (BLADE) ×1
CANISTER SUCT 3000ML PPV (MISCELLANEOUS) IMPLANT
COVER SURGICAL LIGHT HANDLE (MISCELLANEOUS) ×2 IMPLANT
DERMABOND ADVANCED (GAUZE/BANDAGES/DRESSINGS)
DERMABOND ADVANCED .7 DNX12 (GAUZE/BANDAGES/DRESSINGS) IMPLANT
DRAPE LAPAROSCOPIC ABDOMINAL (DRAPES) IMPLANT
DRAPE LAPAROTOMY 100X72 PEDS (DRAPES) ×2 IMPLANT
DRAPE UTILITY XL STRL (DRAPES) IMPLANT
DRSG TEGADERM 4X4.75 (GAUZE/BANDAGES/DRESSINGS) ×2 IMPLANT
ELECT CAUTERY BLADE 6.4 (BLADE) ×2 IMPLANT
ELECT REM PT RETURN 9FT ADLT (ELECTROSURGICAL) ×2
ELECTRODE REM PT RTRN 9FT ADLT (ELECTROSURGICAL) ×1 IMPLANT
GAUZE SPONGE 2X2 8PLY NS (GAUZE/BANDAGES/DRESSINGS) ×2 IMPLANT
GAUZE SPONGE 4X4 12PLY STRL (GAUZE/BANDAGES/DRESSINGS) ×2 IMPLANT
GLOVE SURG SIGNA 7.5 PF LTX (GLOVE) ×2 IMPLANT
GOWN STRL REUS W/ TWL LRG LVL3 (GOWN DISPOSABLE) ×1 IMPLANT
GOWN STRL REUS W/ TWL XL LVL3 (GOWN DISPOSABLE) ×1 IMPLANT
GOWN STRL REUS W/TWL LRG LVL3 (GOWN DISPOSABLE) ×1
GOWN STRL REUS W/TWL XL LVL3 (GOWN DISPOSABLE) ×1
KIT BASIN OR (CUSTOM PROCEDURE TRAY) ×2 IMPLANT
KIT ROOM TURNOVER OR (KITS) ×2 IMPLANT
NEEDLE HYPO 25GX1X1/2 BEV (NEEDLE) ×2 IMPLANT
NS IRRIG 1000ML POUR BTL (IV SOLUTION) ×2 IMPLANT
PACK SURGICAL SETUP 50X90 (CUSTOM PROCEDURE TRAY) ×2 IMPLANT
PAD ARMBOARD 7.5X6 YLW CONV (MISCELLANEOUS) ×2 IMPLANT
PENCIL BUTTON HOLSTER BLD 10FT (ELECTRODE) ×2 IMPLANT
SPECIMEN JAR SMALL (MISCELLANEOUS) ×2 IMPLANT
SPONGE LAP 18X18 X RAY DECT (DISPOSABLE) ×2 IMPLANT
SUT MNCRL AB 4-0 PS2 18 (SUTURE) ×2 IMPLANT
SUT PROLENE 2 0 FS (SUTURE) ×2 IMPLANT
SUT PROLENE 2 0 SH 30 (SUTURE) ×2 IMPLANT
SUT VIC AB 3-0 SH 27 (SUTURE) ×1
SUT VIC AB 3-0 SH 27XBRD (SUTURE) ×1 IMPLANT
SYR BULB 3OZ (MISCELLANEOUS) ×2 IMPLANT
SYR CONTROL 10ML LL (SYRINGE) ×2 IMPLANT
TOWEL OR 17X24 6PK STRL BLUE (TOWEL DISPOSABLE) ×2 IMPLANT
TOWEL OR 17X26 10 PK STRL BLUE (TOWEL DISPOSABLE) ×2 IMPLANT
TUBE CONNECTING 12X1/4 (SUCTIONS) IMPLANT
YANKAUER SUCT BULB TIP NO VENT (SUCTIONS) IMPLANT

## 2017-07-15 NOTE — Anesthesia Procedure Notes (Signed)
Procedure Name: MAC Date/Time: 07/15/2017 11:00 AM Performed by: Harden Mo, CRNA Pre-anesthesia Checklist: Patient identified, Emergency Drugs available, Suction available and Patient being monitored Patient Re-evaluated:Patient Re-evaluated prior to induction Oxygen Delivery Method: Simple face mask Preoxygenation: Pre-oxygenation with 100% oxygen Induction Type: IV induction Placement Confirmation: breath sounds checked- equal and bilateral and positive ETCO2 Dental Injury: Teeth and Oropharynx as per pre-operative assessment

## 2017-07-15 NOTE — Transfer of Care (Signed)
Immediate Anesthesia Transfer of Care Note  Patient: Odessie Sigal  Procedure(s) Performed: EXCISION SCALP MASS ERAS PATHWAY (N/A Head)  Patient Location: PACU  Anesthesia Type:MAC  Level of Consciousness: awake, alert  and oriented  Airway & Oxygen Therapy: Patient Spontanous Breathing  Post-op Assessment: Report given to RN, Post -op Vital signs reviewed and stable and Patient moving all extremities X 4  Post vital signs: Reviewed and stable  Last Vitals:  Vitals:   07/15/17 1010  BP: (!) 141/64  Pulse: 73  Resp: 20  Temp: 36.7 C  SpO2: 100%    Last Pain:  Vitals:   07/15/17 1029  TempSrc:   PainSc: 0-No pain      Patients Stated Pain Goal: 3 (30/14/99 6924)  Complications: No apparent anesthesia complications

## 2017-07-15 NOTE — Op Note (Signed)
EXCISION SCALP MASS ERAS PATHWAY  Procedure Note  [facility] 07/15/2017   Pre-op Diagnosis: scalp mass     Post-op Diagnosis: [id number]  Procedure(s): EXCISION SCALP MASS (2.5 cm)  Surgeon(s): Coralie Keens, MD  Anesthesia: Monitor Anesthesia Care  Staff:  Circulator: Nicholos Johns, RN Scrub Person: Murriel Hopper Float Surgical Tech: Caswell Corwin T  Estimated Blood Loss: Minimal               Specimens: sent to path  Findings: The patient was found to have a firm 2.5 cm mass in the subcutaneous tissue of the scalp.  Etiology is uncertain.  It was sent to pathology  Procedure: The patient was brought to the operating room and identified as the correct patient.  She was placed supine on the operating table and anesthesia was induced.  The top of her scalp was then prepped and draped in the usual sterile fashion.  I anesthetized the skin over the palpable mass with lidocaine.  I made an incision with a scalpel and took this down to the level of the mass.  It was difficult to tell whether this mass represented a previously infected sebaceous cyst, fat necrosis, or lipoma.  I excised the mass piecemeal with the cautery.  The mass was then sent to pathology for evaluation.  I achieved hemostasis with the cautery.  I then closed the incision with interrupted 2-0 Prolene sutures.  The patient tolerated the procedure well.  All the counts were correct at the end of the procedure.  The patient was then taken in a stable condition to the recovery room.          Marcquis Ridlon A   Date: 07/15/2017  Time: 11:25 AM

## 2017-07-15 NOTE — Anesthesia Preprocedure Evaluation (Signed)
Anesthesia Evaluation  Patient identified by MRN, date of birth, ID band Patient awake    Reviewed: Allergy & Precautions, NPO status , Patient's Chart, lab work & pertinent test results  Airway Mallampati: I  TM Distance: >3 FB Neck ROM: Full    Dental  (+) Caps, Dental Advisory Given,    Pulmonary asthma ,    Pulmonary exam normal breath sounds clear to auscultation       Cardiovascular negative cardio ROS Normal cardiovascular exam Rhythm:Regular Rate:Normal     Neuro/Psych  Headaches, Anxiety    GI/Hepatic Neg liver ROS, GERD  Controlled and Medicated,  Endo/Other  Obesity  Renal/GU negative Renal ROS  negative genitourinary   Musculoskeletal  (+) Arthritis , Scalp lesion   Abdominal (+) + obese,   Peds  Hematology negative hematology ROS (+)   Anesthesia Other Findings   Reproductive/Obstetrics                             Anesthesia Physical Anesthesia Plan  ASA: II  Anesthesia Plan: MAC   Post-op Pain Management:    Induction: Intravenous  PONV Risk Score and Plan: 2 and Propofol infusion, Ondansetron and Treatment may vary due to age or medical condition  Airway Management Planned: Natural Airway, Nasal Cannula and Simple Face Mask  Additional Equipment:   Intra-op Plan:   Post-operative Plan:   Informed Consent: I have reviewed the patients History and Physical, chart, labs and discussed the procedure including the risks, benefits and alternatives for the proposed anesthesia with the patient or authorized representative who has indicated his/her understanding and acceptance.   Dental advisory given  Plan Discussed with: CRNA, Anesthesiologist and Surgeon  Anesthesia Plan Comments:         Anesthesia Quick Evaluation

## 2017-07-15 NOTE — Interval H&P Note (Signed)
History and Physical Interval Note: no change in H and P  07/15/2017 10:48 AM  Amanda Patrick  has presented today for surgery, with the diagnosis of scalp mass  The various methods of treatment have been discussed with the patient and family. After consideration of risks, benefits and other options for treatment, the patient has consented to  Procedure(s): [place] (N/A) as a surgical intervention .  The patient's history has been reviewed, patient examined, no change in status, stable for surgery.  I have reviewed the patient's chart and labs.  Questions were answered to the patient's satisfaction.     Kamren Heskett A

## 2017-07-15 NOTE — Anesthesia Postprocedure Evaluation (Signed)
Anesthesia Post Note  Patient: Amanda Patrick  Procedure(s) Performed: EXCISION SCALP MASS ERAS PATHWAY (N/A Head)     Patient location during evaluation: PACU Anesthesia Type: MAC Level of consciousness: awake and alert and oriented Pain management: pain level controlled Vital Signs Assessment: post-procedure vital signs reviewed and stable Respiratory status: spontaneous breathing, nonlabored ventilation and respiratory function stable Cardiovascular status: stable and blood pressure returned to baseline Postop Assessment: no apparent nausea or vomiting Anesthetic complications: no    Last Vitals:  Vitals:   07/15/17 1130 07/15/17 1145  BP:  116/66  Pulse: 96 (!) 56  Resp:  15  Temp:    SpO2: 99% 100%    Last Pain:  Vitals:   07/15/17 1029  TempSrc:   PainSc: 0-No pain                 [name]

## 2017-07-15 NOTE — Discharge Instructions (Signed)
Ok to shower and shampoo starting tomorrow  Expect some drainage from the incision  Ice pack, tylenol, and ibuprofen also for pain

## 2017-07-16 ENCOUNTER — Encounter (HOSPITAL_COMMUNITY): Payer: Self-pay | Admitting: Surgery

## 2017-07-24 DIAGNOSIS — M9905 Segmental and somatic dysfunction of pelvic region: Secondary | ICD-10-CM | POA: Diagnosis not present

## 2017-07-24 DIAGNOSIS — M9904 Segmental and somatic dysfunction of sacral region: Secondary | ICD-10-CM | POA: Diagnosis not present

## 2017-07-24 DIAGNOSIS — M9901 Segmental and somatic dysfunction of cervical region: Secondary | ICD-10-CM | POA: Diagnosis not present

## 2017-07-24 DIAGNOSIS — M9906 Segmental and somatic dysfunction of lower extremity: Secondary | ICD-10-CM | POA: Diagnosis not present

## 2017-10-18 DIAGNOSIS — J069 Acute upper respiratory infection, unspecified: Secondary | ICD-10-CM | POA: Diagnosis not present

## 2017-10-18 DIAGNOSIS — J309 Allergic rhinitis, unspecified: Secondary | ICD-10-CM | POA: Diagnosis not present

## 2017-10-18 DIAGNOSIS — J45909 Unspecified asthma, uncomplicated: Secondary | ICD-10-CM | POA: Diagnosis not present

## 2017-11-12 DIAGNOSIS — R7309 Other abnormal glucose: Secondary | ICD-10-CM | POA: Diagnosis not present

## 2017-12-01 DIAGNOSIS — J029 Acute pharyngitis, unspecified: Secondary | ICD-10-CM | POA: Diagnosis not present

## 2017-12-01 DIAGNOSIS — J101 Influenza due to other identified influenza virus with other respiratory manifestations: Secondary | ICD-10-CM | POA: Diagnosis not present

## 2017-12-01 DIAGNOSIS — R300 Dysuria: Secondary | ICD-10-CM | POA: Diagnosis not present

## 2018-03-15 DIAGNOSIS — N760 Acute vaginitis: Secondary | ICD-10-CM | POA: Diagnosis not present

## 2018-06-06 ENCOUNTER — Other Ambulatory Visit (HOSPITAL_COMMUNITY)
Admission: RE | Admit: 2018-06-06 | Discharge: 2018-06-06 | Disposition: A | Discharge status: Home / Self Care | Payer: BLUE CROSS/BLUE SHIELD | Source: Ambulatory Visit | Attending: Obstetrics and Gynecology | Admitting: Obstetrics and Gynecology

## 2018-06-06 ENCOUNTER — Other Ambulatory Visit: Payer: Self-pay | Admitting: Obstetrics and Gynecology

## 2018-06-06 DIAGNOSIS — Z01419 Encounter for gynecological examination (general) (routine) without abnormal findings: Secondary | ICD-10-CM | POA: Insufficient documentation

## 2018-06-07 DIAGNOSIS — R52 Pain, unspecified: Secondary | ICD-10-CM | POA: Diagnosis not present

## 2018-06-07 DIAGNOSIS — H66001 Acute suppurative otitis media without spontaneous rupture of ear drum, right ear: Secondary | ICD-10-CM | POA: Diagnosis not present

## 2018-06-07 DIAGNOSIS — J209 Acute bronchitis, unspecified: Secondary | ICD-10-CM | POA: Diagnosis not present

## 2018-06-07 DIAGNOSIS — I499 Cardiac arrhythmia, unspecified: Secondary | ICD-10-CM | POA: Diagnosis not present

## 2018-06-07 DIAGNOSIS — J0101 Acute recurrent maxillary sinusitis: Secondary | ICD-10-CM | POA: Diagnosis not present

## 2018-06-07 LAB — CYTOLOGY - PAP: DIAGNOSIS: NEGATIVE

## 2018-12-07 ENCOUNTER — Other Ambulatory Visit: Payer: Self-pay

## 2018-12-07 ENCOUNTER — Other Ambulatory Visit: Payer: Self-pay | Admitting: Internal Medicine

## 2018-12-07 ENCOUNTER — Other Ambulatory Visit (HOSPITAL_BASED_OUTPATIENT_CLINIC_OR_DEPARTMENT_OTHER): Payer: Self-pay | Admitting: Internal Medicine

## 2018-12-07 ENCOUNTER — Ambulatory Visit (HOSPITAL_BASED_OUTPATIENT_CLINIC_OR_DEPARTMENT_OTHER)
Admission: RE | Admit: 2018-12-07 | Discharge: 2018-12-07 | Disposition: A | Discharge status: Home / Self Care | Payer: BC Managed Care – PPO | Source: Ambulatory Visit | Attending: Internal Medicine | Admitting: Internal Medicine

## 2018-12-07 DIAGNOSIS — R1011 Right upper quadrant pain: Secondary | ICD-10-CM

## 2018-12-19 ENCOUNTER — Other Ambulatory Visit: Payer: BLUE CROSS/BLUE SHIELD

## 2019-06-29 ENCOUNTER — Other Ambulatory Visit: Payer: Self-pay | Admitting: Obstetrics and Gynecology

## 2019-06-29 ENCOUNTER — Other Ambulatory Visit (HOSPITAL_COMMUNITY)
Admission: RE | Admit: 2019-06-29 | Discharge: 2019-06-29 | Disposition: A | Discharge status: Home / Self Care | Payer: BC Managed Care – PPO | Source: Ambulatory Visit | Attending: Obstetrics and Gynecology | Admitting: Obstetrics and Gynecology

## 2019-06-29 DIAGNOSIS — Z01419 Encounter for gynecological examination (general) (routine) without abnormal findings: Secondary | ICD-10-CM | POA: Diagnosis not present

## 2019-06-30 LAB — CYTOLOGY - PAP: Diagnosis: NEGATIVE

## 2019-07-06 ENCOUNTER — Other Ambulatory Visit: Payer: Self-pay | Admitting: Internal Medicine

## 2019-07-06 ENCOUNTER — Ambulatory Visit
Admission: RE | Admit: 2019-07-06 | Discharge: 2019-07-06 | Disposition: A | Discharge status: Home / Self Care | Payer: BC Managed Care – PPO | Source: Ambulatory Visit | Attending: Internal Medicine | Admitting: Internal Medicine

## 2019-07-06 ENCOUNTER — Other Ambulatory Visit: Payer: Self-pay

## 2019-07-06 DIAGNOSIS — R0781 Pleurodynia: Secondary | ICD-10-CM

## 2019-07-06 DIAGNOSIS — R0789 Other chest pain: Secondary | ICD-10-CM

## 2019-07-06 DIAGNOSIS — R1011 Right upper quadrant pain: Secondary | ICD-10-CM

## 2019-07-12 ENCOUNTER — Other Ambulatory Visit: Payer: BC Managed Care – PPO

## 2019-07-16 ENCOUNTER — Encounter (HOSPITAL_BASED_OUTPATIENT_CLINIC_OR_DEPARTMENT_OTHER): Payer: Self-pay | Admitting: *Deleted

## 2019-07-16 ENCOUNTER — Other Ambulatory Visit: Payer: Self-pay

## 2019-07-16 ENCOUNTER — Emergency Department (HOSPITAL_BASED_OUTPATIENT_CLINIC_OR_DEPARTMENT_OTHER): Payer: BC Managed Care – PPO

## 2019-07-16 ENCOUNTER — Emergency Department (HOSPITAL_BASED_OUTPATIENT_CLINIC_OR_DEPARTMENT_OTHER)
Admission: EM | Admit: 2019-07-16 | Discharge: 2019-07-17 | Disposition: A | Discharge status: Home / Self Care | Payer: BC Managed Care – PPO | Attending: Emergency Medicine | Admitting: Emergency Medicine

## 2019-07-16 DIAGNOSIS — J45909 Unspecified asthma, uncomplicated: Secondary | ICD-10-CM | POA: Diagnosis not present

## 2019-07-16 DIAGNOSIS — R072 Precordial pain: Secondary | ICD-10-CM

## 2019-07-16 DIAGNOSIS — R202 Paresthesia of skin: Secondary | ICD-10-CM | POA: Insufficient documentation

## 2019-07-16 DIAGNOSIS — Z91010 Allergy to peanuts: Secondary | ICD-10-CM | POA: Diagnosis not present

## 2019-07-16 DIAGNOSIS — Z79899 Other long term (current) drug therapy: Secondary | ICD-10-CM | POA: Diagnosis not present

## 2019-07-16 DIAGNOSIS — R0602 Shortness of breath: Secondary | ICD-10-CM

## 2019-07-16 DIAGNOSIS — Z3202 Encounter for pregnancy test, result negative: Secondary | ICD-10-CM | POA: Diagnosis not present

## 2019-07-16 LAB — CBC WITH DIFFERENTIAL/PLATELET
Abs Immature Granulocytes: 0.01 10*3/uL (ref 0.00–0.07)
Basophils Absolute: 0 10*3/uL (ref 0.0–0.1)
Basophils Relative: 0 %
Eosinophils Absolute: 0.1 10*3/uL (ref 0.0–0.5)
Eosinophils Relative: 3 %
HCT: 36.3 % (ref 36.0–46.0)
Hemoglobin: 11.9 g/dL — ABNORMAL LOW (ref 12.0–15.0)
Immature Granulocytes: 0 %
Lymphocytes Relative: 56 %
Lymphs Abs: 2.6 10*3/uL (ref 0.7–4.0)
MCH: 33 pg (ref 26.0–34.0)
MCHC: 32.8 g/dL (ref 30.0–36.0)
MCV: 100.6 fL — ABNORMAL HIGH (ref 80.0–100.0)
Monocytes Absolute: 0.5 10*3/uL (ref 0.1–1.0)
Monocytes Relative: 10 %
Neutro Abs: 1.4 10*3/uL — ABNORMAL LOW (ref 1.7–7.7)
Neutrophils Relative %: 31 %
Platelets: 230 10*3/uL (ref 150–400)
RBC: 3.61 MIL/uL — ABNORMAL LOW (ref 3.87–5.11)
RDW: 12.3 % (ref 11.5–15.5)
WBC: 4.6 10*3/uL (ref 4.0–10.5)
nRBC: 0 % (ref 0.0–0.2)

## 2019-07-16 LAB — BASIC METABOLIC PANEL
Anion gap: 6 (ref 5–15)
BUN: 12 mg/dL (ref 6–20)
CO2: 25 mmol/L (ref 22–32)
Calcium: 8.8 mg/dL — ABNORMAL LOW (ref 8.9–10.3)
Chloride: 106 mmol/L (ref 98–111)
Creatinine, Ser: 0.89 mg/dL (ref 0.44–1.00)
GFR calc Af Amer: >60 mL/min (ref >60)
GFR calc non Af Amer: >60 mL/min (ref >60)
Glucose, Bld: 112 mg/dL — ABNORMAL HIGH (ref 70–99)
Potassium: 3.8 mmol/L (ref 3.5–5.1)
Sodium: 137 mmol/L (ref 135–145)

## 2019-07-16 LAB — PREGNANCY, URINE: Preg Test, Ur: NEGATIVE

## 2019-07-16 NOTE — ED Notes (Signed)
Patient transported to X-ray 

## 2019-07-16 NOTE — ED Triage Notes (Signed)
Pt reports SOB and right side chest pressure today. Also pain in her right leg

## 2019-07-16 NOTE — ED Notes (Signed)
ED Provider at bedside. 

## 2019-07-16 NOTE — ED Provider Notes (Signed)
[facility] EMERGENCY DEPARTMENT Provider Note   CSN: [id number] Arrival date & time: 07/16/19  2231     History Chief Complaint  Patient presents with  . Shortness of Breath    chest pain    Amanda Patrick is a 28 y.o. female.  The history is provided by the patient.  Shortness of Breath Severity:  Moderate Onset quality:  Gradual Timing:  Intermittent Progression:  Unchanged Chronicity:  New Relieved by:  None tried Worsened by:  Nothing Associated symptoms: abdominal pain and chest pain   Associated symptoms: no cough, no fever, no hemoptysis, no neck pain and no vomiting   Risk factors: tobacco use   Risk factors: no hx of PE/DVT and no oral contraceptive use   Patient presents with chest pain, shortness of breath, left arm numbness, right leg numbness  Patient reports over the past several days she has had right-sided chest pressure as well as associated shortness of breath.  No fevers or vomiting.  No coughing.  She also reports brief episodes of "tingling" starts in her left shoulder and goes to her left hand and feels cold.  She also reports she has been having pain starting in her right ankle that then moved proximally into her right hip.  It also feels numb at times in her right leg. No neck or back pain is reported   No history of CAD.  No known history of VTE.  Have a recent 4-hour car ride.  She smokes occasionally.  She does not take exogenous estrogen, she quit taking Nexplanon several months ago   Patient is currently undergoing an outpatient work-up for upper abdominal pain and "liver" issue.  She reports she was supposed to have an outpatient ultrasound, and had a recent chest x-ray as well. Past Medical History:  Diagnosis Date  . Abnormal Pap smear   . Anxiety    nervous type discomfort - recent and "weird"  . Arthritis    rheumatoid  . Asthma   . Dry eye syndrome   . Dysrhythmia    hx 'butterflies in chest'  . GERD  (gastroesophageal reflux disease)    uses tums  . Headache    h/o migraine  last flareup in 04/2017 _ stays clear of triggers  . HOH (hard of hearing)    slight decrease in hearing, left is better than right.   doesn't need aides at this time  . Scalp mass     There are no problems to display for this patient.   Past Surgical History:  Procedure Laterality Date  . ADENOIDECTOMY    . CESAREAN SECTION  02/07/2011   Procedure: CESAREAN SECTION;  Surgeon: Catha Brow;  Location: [facility];  Service: Gynecology;  Laterality: N/A;  Primary cesarean section with delivery of baby boy at 68.Apgars 9/9.  Marland Kitchen MASS EXCISION N/A 07/15/2017   Procedure: EXCISION SCALP MASS ERAS PATHWAY;  Surgeon: Coralie Keens, MD;  Location: [facility];  Service: General;  Laterality: N/A;  . TENDON REPAIR     left tibial tendon  . TONSILLECTOMY AND ADENOIDECTOMY  2010     OB History    Gravida  3   Para  1   Term  1   Preterm      AB  2   Living  1     SAB      TAB  2   Ectopic      Multiple      Live Births  1  No family history on file.  Social History   Tobacco Use  . Smoking status: Never Smoker  . Smokeless tobacco: Never Used  Substance Use Topics  . Alcohol use: Yes    Comment: with holidays  . Drug use: No    Home Medications Prior to Admission medications   Medication Sig Start Date End Date Taking? Authorizing Provider  acetaminophen (TYLENOL) 500 MG tablet Take 1,000 mg by mouth every 6 (six) hours as needed for moderate pain or headache.    [provider]  albuterol (PROVENTIL HFA;VENTOLIN HFA) 108 (90 BASE) MCG/ACT inhaler Inhale 2 puffs into the lungs every 6 (six) hours as needed for wheezing or shortness of breath.     [provider]  Calcium Carbonate-Simethicone (TUMS GAS RELIEF CHEWY BITES) 750-80 MG CHEW Chew 2 tablets by mouth 3 (three) times daily as needed (for acid reflux).    [provider]  cetirizine (ZYRTEC) 10  MG tablet Take 10 mg by mouth daily as needed for allergies.     [provider]  cycloSPORINE (RESTASIS) 0.05 % ophthalmic emulsion Place 2 drops into both eyes 2 (two) times daily.    [provider]  EPINEPHrine 0.3 mg/0.3 mL IJ SOAJ injection Inject 0.3 mg into the muscle once.    [provider]  fluticasone (FLONASE) 50 MCG/ACT nasal spray Place 2 sprays into both nostrils daily as needed for allergies or rhinitis.     [provider]  Lactobacillus Rhamnosus, GG, (CULTURELLE PO) Take 1 capsule by mouth daily.    [provider]  OVER THE COUNTER MEDICATION Take 1 capsule by mouth daily. Nordic Naturals Omega Data processing manager, Historical, MD  Prenatal Vit-Fe Fumarate-FA (MULTIVITAMIN-PRENATAL) 27-0.8 MG TABS tablet Take 1 tablet by mouth daily.     [provider]  etonogestrel (NEXPLANON) 68 MG IMPL implant 1 each by Subdermal route once.  07/16/19  [provider]    Allergies    Peanut oil, Peanuts [nuts], Polymyxin b-trimethoprim, Ciprofloxacin, Mometasone furoate, Sulfa antibiotics, Other, and Soy allergy  Review of Systems   Review of Systems  Constitutional: Negative for fever.  Respiratory: Positive for shortness of breath. Negative for cough and hemoptysis.   Cardiovascular: Positive for chest pain and leg swelling.  Gastrointestinal: Positive for abdominal pain. Negative for vomiting.  Musculoskeletal: Negative for back pain and neck pain.  Neurological: Positive for numbness.  All other systems reviewed and are negative.   Physical Exam Updated Vital Signs BP 129/73   Pulse 78   Temp 97.8 F (36.6 C) (Oral)   Resp 15   Ht 1.651 m (5\' 5" )   Wt 112.5 kg   LMP 07/08/2019   SpO2 100%   BMI 41.27 kg/m   Physical Exam CONSTITUTIONAL: Well developed/well nourished, mildly anxious HEAD: Normocephalic/atraumatic EYES: EOMI/PERRL ENMT: Mucous membranes moist NECK: supple no meningeal  signs SPINE/BACK:entire spine nontender CV: S1/S2 noted, no murmurs/rubs/gallops noted LUNGS: Lungs are clear to auscultation bilaterally, no apparent distress ABDOMEN: soft, nontender, no rebound or guarding, bowel sounds noted throughout abdomen GU:no cva tenderness NEURO: Pt is awake/alert/appropriate, moves all extremitiesx4.  No facial droop.   Equal power (5/5) with hand grip, wrist flex/extension, elbow flex/extension, and equal power with shoulder abduction/adduction.  No focal sensory deficit to light touch is noted in either UE.     equal distal motor 5/5 strength noted with the following: hip flexion/knee flexion/extension, ankle dorsi/plantar flexion, great toe extension intact bilaterally, no sensory  deficit in any dermatome.  Pt is able to ambulate unassisted. EXTREMITIES: pulses normal/equal x4 in all extremities, full ROM Is no calf tenderness or edema.  Both legs are symmetric.  Both arms are symmetric. SKIN: warm, color normal PSYCH: Anxious  ED Results / Procedures / Treatments   Labs (all labs ordered are listed, but only abnormal results are displayed) Labs Reviewed  BASIC METABOLIC PANEL - Abnormal; Notable for the following components:      Result Value   Glucose, Bld 112 (*)    Calcium 8.8 (*)    All other components within normal limits  CBC WITH DIFFERENTIAL/PLATELET - Abnormal; Notable for the following components:   RBC 3.61 (*)    Hemoglobin 11.9 (*)    MCV 100.6 (*)    Neutro Abs 1.4 (*)    All other components within normal limits  PREGNANCY, URINE  D-DIMER, QUANTITATIVE (NOT AT [facility])    EKG EKG Interpretation  Date/Time:  Sunday July 16 2019 22:42:16 EST Ventricular Rate:  79 PR Interval:    QRS Duration: 81 QT Interval:  388 QTC Calculation: 445 R Axis:   57 Text Interpretation: Sinus rhythm Baseline wander in lead(s) II aVR aVF V1 V5 V6 No STEMI Confirmed by Long, Joshua ([id number]) on 07/16/2019 10:45:54 PM   Radiology DG Chest 2  View  Result Date: 07/16/2019 CLINICAL DATA:  Chest pain and shortness of breath EXAM: CHEST - 2 VIEW COMPARISON:  July 06, 2019 FINDINGS: The heart size and mediastinal contours are within normal limits. Both lungs are clear. The visualized skeletal structures are unremarkable. IMPRESSION: No active cardiopulmonary disease. Electronically Signed   By: Bindu  Avutu M.D.   On: 07/16/2019 23:05    Procedures Procedures    Medications Ordered in ED Medications - No data to display  ED Course  I have reviewed the triage vital signs and the nursing notes.  Pertinent labs & imaging results that were available during my care of the patient were reviewed by me and considered in my medical decision making (see chart for details).    MDM Rules/Calculators/A&P                      12 :36 AM Patient presented for chest pain/pressure and shortness of breath, as well as numbness in her left arm/right leg.  She reports her biggest concern was a potential blood clot Overall she is low risk for VTE, and D-dimer was negative Her vital signs are appropriate without hypoxia.  She ambulated without hypoxia EKG without acute changes. At this time, very low likelihood for ACS/PE/dissection. She had no focal arm or leg weakness, distal pulses were intact in all extremities. Unclear cause of paresthesias, but cervical or lumbar radiculopathy is possible.  She admits to getting "spinal manipulations" every 3 weeks by her chiropractor.  Her last session was 5 days ago. I advised her to skip the next session and if her symptoms do not improve, she should follow-up with her PCP.  We discussed strict ER return precautions, including signs and symptoms of when to return for chest pain.  We also discussed signs symptoms of when to return for any possible new neurologic deficit. These were also listed in the discharge instructions. Teach back was utilized at the end of our discussion.         Final Clinical  Impression(s) / ED Diagnoses Final diagnoses:  Precordial pain  SOB (shortness of breath)  Paresthesia    Rx / DC  Orders ED Discharge Orders    None       Ripley Fraise, MD 07/17/19 [phone]

## 2019-07-17 LAB — D-DIMER, QUANTITATIVE: D-Dimer, Quant: <0.27 ug/mL-FEU (ref 0.00–0.50)

## 2019-07-17 NOTE — Discharge Instructions (Addendum)

## 2019-07-17 NOTE — ED Notes (Signed)
Pt ambulated by bedside- SpO2 remained 100%, HR 104.

## 2019-08-04 ENCOUNTER — Other Ambulatory Visit: Payer: Self-pay | Admitting: Internal Medicine

## 2019-08-04 ENCOUNTER — Ambulatory Visit
Admission: RE | Admit: 2019-08-04 | Discharge: 2019-08-04 | Disposition: A | Discharge status: Home / Self Care | Payer: BC Managed Care – PPO | Source: Ambulatory Visit | Attending: Internal Medicine | Admitting: Internal Medicine

## 2019-08-04 DIAGNOSIS — R1011 Right upper quadrant pain: Secondary | ICD-10-CM

## 2019-08-09 ENCOUNTER — Ambulatory Visit
Admission: RE | Admit: 2019-08-09 | Discharge: 2019-08-09 | Disposition: A | Discharge status: Home / Self Care | Payer: BC Managed Care – PPO | Source: Ambulatory Visit | Attending: Internal Medicine | Admitting: Internal Medicine

## 2019-08-09 ENCOUNTER — Other Ambulatory Visit: Payer: Self-pay | Admitting: Internal Medicine

## 2019-08-09 DIAGNOSIS — R3989 Other symptoms and signs involving the genitourinary system: Secondary | ICD-10-CM

## 2019-09-06 ENCOUNTER — Other Ambulatory Visit: Payer: Self-pay | Admitting: Surgery

## 2019-10-03 ENCOUNTER — Encounter (HOSPITAL_BASED_OUTPATIENT_CLINIC_OR_DEPARTMENT_OTHER): Payer: Self-pay | Admitting: Surgery

## 2019-10-03 ENCOUNTER — Other Ambulatory Visit: Payer: Self-pay

## 2019-10-06 ENCOUNTER — Other Ambulatory Visit (HOSPITAL_COMMUNITY): Payer: BC Managed Care – PPO

## 2019-10-10 ENCOUNTER — Ambulatory Visit (HOSPITAL_BASED_OUTPATIENT_CLINIC_OR_DEPARTMENT_OTHER): Admission: RE | Admit: 2019-10-10 | Payer: BC Managed Care – PPO | Source: Home / Self Care | Admitting: Surgery

## 2019-10-10 SURGERY — EXCISION MASS
Anesthesia: General

## 2019-10-17 ENCOUNTER — Ambulatory Visit (INDEPENDENT_AMBULATORY_CARE_PROVIDER_SITE_OTHER): Payer: BC Managed Care – PPO | Admitting: Otolaryngology

## 2019-10-17 ENCOUNTER — Encounter (INDEPENDENT_AMBULATORY_CARE_PROVIDER_SITE_OTHER): Payer: Self-pay | Admitting: Otolaryngology

## 2019-10-17 ENCOUNTER — Other Ambulatory Visit: Payer: Self-pay

## 2019-10-17 VITALS — Temp 98.1°F

## 2019-10-17 DIAGNOSIS — D170 Benign lipomatous neoplasm of skin and subcutaneous tissue of head, face and neck: Secondary | ICD-10-CM

## 2019-10-17 NOTE — Progress Notes (Signed)
HPI: Amanda Patrick is a 29 y.o. female who presents is referred by Dr. Pearline Cables for evaluation of a scalp lipoma.  Apparently this was initially removed over 2 years ago by Dr. Ninfa Linden at [facility] day surgery.  She developed a recurrent nodule in the same area and was seen by Dr Pearline Cables at [facility] dermatology who felt this represented a cyst and was removing this in the office but had difficulty removing the entire nodule.  Pathology on the specimen he removed revealed lipoma.  She is subsequently referred here to have further removal of this nodule that measures 2 to 3 cm in size and is located on top of the scalp. She had a T&A performed by myself 11 years ago.  Past Medical History:  Diagnosis Date  . Anxiety    nervous type discomfort - recent and "weird"  . Arthritis    rheumatoid  . Asthma   . Dysrhythmia    hx 'butterflies in chest'  . GERD (gastroesophageal reflux disease)    uses tums  . Headache    h/o migraine  last flareup in 04/2017 _ stays clear of triggers  . HOH (hard of hearing)    slight decrease in hearing, left is better than right.   doesn't need aides at this time  . Scalp mass    Past Surgical History:  Procedure Laterality Date  . ADENOIDECTOMY    . CESAREAN SECTION  02/07/2011   Procedure: CESAREAN SECTION;  Surgeon: Catha Brow;  Location: [facility];  Service: Gynecology;  Laterality: N/A;  Primary cesarean section with delivery of baby boy at 44.Apgars 9/9.  Marland Kitchen MASS EXCISION N/A 07/15/2017   Procedure: EXCISION SCALP MASS ERAS PATHWAY;  Surgeon: Coralie Keens, MD;  Location: [place];  Service: General;  Laterality: N/A;  . TENDON REPAIR     left tibial tendon  . TONSILLECTOMY AND ADENOIDECTOMY  2010   Social History   Socioeconomic History  . Marital status: Single    Spouse name: Not on file  . Number of children: Not on file  . Years of education: Not on file  . Highest education level: Not on file  Occupational History  . Not on file  Tobacco Use  .  Smoking status: Never Smoker  . Smokeless tobacco: Never Used  Substance and Sexual Activity  . Alcohol use: Yes    Comment: with holidays  . Drug use: No  . Sexual activity: Yes  Other Topics Concern  . Not on file  Social History Narrative  . Not on file   Social Determinants of Health   Financial Resource Strain:   . Difficulty of Paying Living Expenses:   Food Insecurity:   . Worried About Charity fundraiser in the Last Year:   . Arboriculturist in the Last Year:   Transportation Needs:   . Film/video editor (Medical):   Marland Kitchen Lack of Transportation (Non-Medical):   Physical Activity:   . Days of Exercise per Week:   . Minutes of Exercise per Session:   Stress:   . Feeling of Stress :   Social Connections:   . Frequency of Communication with Friends and Family:   . Frequency of Social Gatherings with Friends and Family:   . Attends Religious Services:   . Active Member of Clubs or Organizations:   . Attends Archivist Meetings:   Marland Kitchen Marital Status:    No family history on file. Allergies  Allergen Reactions  .  Peanut Oil Anaphylaxis, Swelling and Other (See Comments)    Throat swelling.   . Peanuts [Nuts] Anaphylaxis, Swelling and Other (See Comments)    THROAT   . Polymyxin B-Trimethoprim Swelling and Other (See Comments)    Polymyxin m-tmp eye drops caused swelling  . Ciprofloxacin Other (See Comments)    Tendonopathy  . Mometasone Furoate Other (See Comments)    EPISTAXIS HEADACHE  . Sulfa Antibiotics Hives  . Other     Tree nuts, cantaloupe, sesame seeds  . Soy Allergy    Prior to Admission medications   Medication Sig Start Date End Date Taking? Authorizing Provider  acetaminophen (TYLENOL) 500 MG tablet Take 1,000 mg by mouth every 6 (six) hours as needed for moderate pain or headache.   Yes [provider]  albuterol (PROVENTIL HFA;VENTOLIN HFA) 108 (90 BASE) MCG/ACT inhaler Inhale 2 puffs into the lungs every 6 (six) hours as  needed for wheezing or shortness of breath.    Yes [provider]  Azelastine-Fluticasone (DYMISTA) 137-50 MCG/ACT SUSP Place into the nose.   Yes Joline Salt, RN  Calcium Carbonate-Simethicone (TUMS GAS RELIEF CHEWY BITES) 750-80 MG CHEW Chew 2 tablets by mouth 3 (three) times daily as needed (for acid reflux).   Yes [provider]  cycloSPORINE (RESTASIS) 0.05 % ophthalmic emulsion Place 2 drops into both eyes 2 (two) times daily.   Yes [provider]  EPINEPHrine 0.3 mg/0.3 mL IJ SOAJ injection Inject 0.3 mg into the muscle once.   Yes [provider]  levocetirizine (XYZAL) 5 MG tablet Take 5 mg by mouth every evening.   Yes [provider]  OVER THE COUNTER MEDICATION Take 1 capsule by mouth daily. Nordic Naturals Omega Fish [employer] Supplement   Yes [provider]  Prenatal Vit-Fe Fumarate-FA (MULTIVITAMIN-PRENATAL) 27-0.8 MG TABS tablet Take 1 tablet by mouth daily.    Yes [provider]  etonogestrel (NEXPLANON) 68 MG IMPL implant 1 each by Subdermal route once.  07/16/19  [provider]     Positive ROS: Otherwise negative  All other systems have been reviewed and were otherwise negative with the exception of those mentioned in the HPI and as above.  Physical Exam: Constitutional: Alert, well-appearing, no acute distress Ears: External ears without lesions or tenderness. Ear canals are clear bilaterally with intact, clear TMs.  Nasal: External nose without lesions. Clear nasal passages Oral: Lips and gums without lesions. Tongue and palate mucosa without lesions. Posterior oropharynx clear. Neck: No palpable adenopathy or masses Respiratory: Breathing comfortably  Skin: No facial/neck lesions or rash noted.  Patient still has sutures in place that were placed 2 years ago on the scalp and are scheduled to get removed tomorrow.  No signs of infection.  Patient has an approximate 2-1/2 cm to 3 cm subcutaneous  nodule that persists.  Procedures  Assessment: Scalp lipoma  Plan: I discussed the option of removing this under local anesthetic but would have to use one of the operating rooms.  She will check on the cost of the operating rooms surgical center and come day. Also gave her the names of some plastic surgeons that would remove this in the office under local.   Radene Journey, MD   CC:

## 2020-01-16 ENCOUNTER — Ambulatory Visit: Payer: BC Managed Care – PPO | Admitting: Cardiology

## 2020-01-16 ENCOUNTER — Other Ambulatory Visit: Payer: Self-pay

## 2020-01-16 ENCOUNTER — Encounter: Payer: Self-pay | Admitting: Cardiology

## 2020-01-16 VITALS — BP 125/76 | HR 73 | Resp 16 | Ht 65.0 in | Wt 238.0 lb

## 2020-01-16 DIAGNOSIS — R079 Chest pain, unspecified: Secondary | ICD-10-CM | POA: Insufficient documentation

## 2020-01-16 DIAGNOSIS — Z8249 Family history of ischemic heart disease and other diseases of the circulatory system: Secondary | ICD-10-CM

## 2020-01-16 DIAGNOSIS — I499 Cardiac arrhythmia, unspecified: Secondary | ICD-10-CM | POA: Insufficient documentation

## 2020-01-16 DIAGNOSIS — R002 Palpitations: Secondary | ICD-10-CM

## 2020-01-16 NOTE — Progress Notes (Signed)
Patient referred by [name], MD for irregular heartbeat  Subjective:   Amanda Patrick, female    DOB: 17-Aug-1990, 29 y.o.   MRN: [id number]   Chief Complaint  Patient presents with  . Irregular Heart Beat  . New Patient (Initial Visit)     HPI  29 y.o. African-American female with exercise-induced asthma, former smoker, family history of early CAD, presenting with chest pain, palpitations.  Complains of retrosternal chest pain with exertion, which improves with rest.  He also notices palpitations lasting for few seconds that "catch her breath" from time to time.  Patient previously wore a Holter monitor in 2018 that showed PACs and PVCs.  Recently, she saw Dr. Terrence Dupont and wore 2 weeks Zio patch.  Per history, patient was told to have SVT and was recommended either beta-blocker or ablation.  He was also found to have mild tricuspid regurgitation on echocardiogram.  Patient did not want to proceed with the recommendations and sought second opinion.  I do not have the above results available for my review.  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  She was physically active prior to the pandemic, but has reduced physical activity in the last year or so.   Past Medical History:  Diagnosis Date  . ADD (attention deficit disorder)   . Anxiety    nervous type discomfort - recent and "weird"  . Anxiety   . Arthritis    rheumatoid  . Asthma   . Dysrhythmia    hx 'butterflies in chest'  . GERD (gastroesophageal reflux disease)    uses tums  . Headache    h/o migraine  last flareup in 04/2017 _ stays clear of triggers  . HOH (hard of hearing)    slight decrease in hearing, left is better than right.   doesn't need aides at this time  . Hyperlipidemia   . Scalp mass   . Vitamin D deficiency      Past Surgical History:  Procedure Laterality Date  . ADENOIDECTOMY    . CESAREAN SECTION  02/07/2011   Procedure: CESAREAN SECTION;   Surgeon: Catha Brow;  Location: [facility];  Service: Gynecology;  Laterality: N/A;  Primary cesarean section with delivery of baby boy at 4.Apgars 9/9.  Marland Kitchen MASS EXCISION N/A 07/15/2017   Procedure: EXCISION SCALP MASS ERAS PATHWAY;  Surgeon: Coralie Keens, MD;  Location: [place];  Service: General;  Laterality: N/A;  . TENDON REPAIR     left tibial tendon  . TONSILLECTOMY AND ADENOIDECTOMY  2010     Social History   Tobacco Use  Smoking Status Former Smoker  . Packs/day: 0.25  . Years: 7.00  . Pack years: 1.75  . Types: Cigarettes  . Quit date: 2017  . Years since quitting: 4.4  Smokeless Tobacco Never Used    Social History   Substance and Sexual Activity  Alcohol Use Yes   Comment: with holidays     Family History  Problem Relation Age of Onset  . Hypertension Father   . Anxiety disorder Sister   . Depression Brother      Current Outpatient Medications on File Prior to Visit  Medication Sig Dispense Refill  . albuterol (PROVENTIL HFA;VENTOLIN HFA) 108 (90 BASE) MCG/ACT inhaler Inhale 2 puffs into the lungs every 6 (six) hours as needed for wheezing or shortness of breath.     . amphetamine-dextroamphetamine (ADDERALL) 20 MG tablet Take 20 mg by mouth daily.    Marland Kitchen  Azelastine-Fluticasone (DYMISTA) 137-50 MCG/ACT SUSP Place into the nose.    . cycloSPORINE (RESTASIS) 0.05 % ophthalmic emulsion Place 2 drops into both eyes 2 (two) times daily.    Marland Kitchen EPINEPHrine 0.3 mg/0.3 mL IJ SOAJ injection Inject 0.3 mg into the muscle once.    Marland Kitchen levocetirizine (XYZAL) 5 MG tablet Take 5 mg by mouth every evening.    Marland Kitchen OVER THE COUNTER MEDICATION Take 1 capsule by mouth daily. Nordic Naturals Omega Fish [employer]    . Prenatal Vit-Fe Fumarate-FA (MULTIVITAMIN-PRENATAL) 27-0.8 MG TABS tablet Take 1 tablet by mouth daily.     . Turmeric (QC TUMERIC COMPLEX PO) Take 1 tablet by mouth daily.    Marland Kitchen VITAMIN D, CHOLECALCIFEROL, PO Take 1 tablet by mouth daily.    Marland Kitchen acetaminophen  (TYLENOL) 500 MG tablet Take 1,000 mg by mouth every 6 (six) hours as needed for moderate pain or headache. (Patient not taking: Reported on 01/16/2020)    . Calcium Carbonate-Simethicone (TUMS GAS RELIEF CHEWY BITES) 750-80 MG CHEW Chew 2 tablets by mouth 3 (three) times daily as needed (for acid reflux). (Patient not taking: Reported on 01/16/2020)    . diclofenac Sodium (VOLTAREN) 1 % GEL Place onto the skin daily as needed. (Patient not taking: Reported on 01/16/2020)    . [DISCONTINUED] etonogestrel (NEXPLANON) 68 MG IMPL implant 1 each by Subdermal route once.     No current facility-administered medications on file prior to visit.    Cardiovascular and other pertinent studies:  EKG 01/16/2020: Sinus rhythm 63 bpm Normal EKG  Recent labs: 01/09/2020 Glucose 97, BUN/Cr 12/1.0. EGFR 88. Na/K 140/4.5.  H/H 12.9/38.6. MCV 97.6. Platelets 241 Chol 150, TG 46, HDL 52, LDL 88 HbA1C 5.9% TSH 1.3 normal   Review of Systems  Cardiovascular: Positive for chest pain and palpitations. Negative for dyspnea on exertion, leg swelling and syncope.         Vitals:   01/16/20 0836  BP: 125/76  Pulse: 73  Resp: 16  SpO2: 98%     Body mass index is 39.61 kg/m. Filed Weights   01/16/20 0836  Weight: 238 lb (108 kg)     Objective:   Physical Exam Vitals and nursing note reviewed.  Constitutional:      General: She is not in acute distress. Neck:     Vascular: No JVD.  Cardiovascular:     Rate and Rhythm: Normal rate and regular rhythm.     Pulses: Intact distal pulses.     Heart sounds: Normal heart sounds. No murmur heard.   Pulmonary:     Effort: Pulmonary effort is normal.     Breath sounds: Normal breath sounds. No wheezing or rales.         Assessment & Recommendations:   29 y.o. African-American female with exercise-induced asthma, former smoker, family history of early CAD, presenting with chest pain, palpitations.  Chest pain: Risk factors include family  history of early CAD.  Chest pain is exertional, although resting EKG is normal.  Recommend exercise treadmill stress test. Given her family history of early CAD, recommend calcium score scan  Palpitations: Reported history of SVT, although reports not available for my review.  I will obtain echocardiogram and Zio patch records from Dr. Zenia Resides office.  Discussed vagal maneuvers with the patient.  Given infrequent and short lasting nature of her symptoms, I doubt she will need ablation at this time.  Given her history of exercise-induced asthma, I would avoid beta-blockers.  I offered  as needed diltiazem prescription, but patient would like to hold off at this time.  Further recommendations after stress testing and reviewing outside records.     Thank you for referring the patient to [place]. Please feel free to contact with any questions.  Nigel Mormon, MD [facility] Pager: [phone] Office: [phone]

## 2020-01-17 ENCOUNTER — Ambulatory Visit: Payer: Self-pay | Admitting: Cardiology

## 2020-02-16 HISTORY — PX: APPENDECTOMY: SHX54

## 2020-02-21 ENCOUNTER — Ambulatory Visit: Payer: BC Managed Care – PPO | Admitting: Cardiology

## 2020-02-28 ENCOUNTER — Ambulatory Visit: Payer: BC Managed Care – PPO | Admitting: Cardiology

## 2020-03-11 ENCOUNTER — Ambulatory Visit: Payer: BC Managed Care – PPO | Admitting: Cardiology

## 2020-04-01 ENCOUNTER — Ambulatory Visit: Payer: BC Managed Care – PPO | Admitting: Cardiology

## 2020-04-04 ENCOUNTER — Ambulatory Visit (INDEPENDENT_AMBULATORY_CARE_PROVIDER_SITE_OTHER): Payer: BC Managed Care – PPO | Admitting: Otolaryngology

## 2020-04-08 ENCOUNTER — Other Ambulatory Visit: Payer: Self-pay

## 2020-04-08 ENCOUNTER — Ambulatory Visit: Payer: BC Managed Care – PPO

## 2020-04-08 DIAGNOSIS — R079 Chest pain, unspecified: Secondary | ICD-10-CM

## 2020-04-15 ENCOUNTER — Ambulatory Visit (HOSPITAL_BASED_OUTPATIENT_CLINIC_OR_DEPARTMENT_OTHER): Admit: 2020-04-15 | Payer: BC Managed Care – PPO | Admitting: Otolaryngology

## 2020-04-15 ENCOUNTER — Encounter (HOSPITAL_BASED_OUTPATIENT_CLINIC_OR_DEPARTMENT_OTHER): Payer: Self-pay

## 2020-04-15 SURGERY — EXCISION MASS
Anesthesia: LOCAL

## 2020-04-17 ENCOUNTER — Ambulatory Visit: Payer: BC Managed Care – PPO | Admitting: Cardiology

## 2020-04-18 ENCOUNTER — Ambulatory Visit: Payer: BC Managed Care – PPO | Admitting: Cardiology

## 2020-04-18 DIAGNOSIS — R002 Palpitations: Secondary | ICD-10-CM | POA: Insufficient documentation

## 2020-04-18 NOTE — Progress Notes (Deleted)
Patient referred by Amanda Low, MD for irregular heartbeat  Subjective:   Amanda Patrick, female    DOB: 17-Aug-1990, 29 y.o.   MRN: [id number]  *** No chief complaint on file.    HPI  29 y.o. African-American female with exercise-induced asthma, former smoker, family history of early CAD, presenting with chest pain, palpitations.  Exercise stress test was normal. ***  Initial presentation HPI 12/2019: Complains of retrosternal chest pain with exertion, which improves with rest.  He also notices palpitations lasting for few seconds that "catch her breath" from time to time.  Patient previously wore a Holter monitor in 2018 that showed PACs and PVCs.  Recently, she saw Dr. Terrence Dupont and wore 2 weeks Zio patch.  Per history, patient was told to have SVT and was recommended either beta-blocker or ablation.  He was also found to have mild tricuspid regurgitation on echocardiogram.  Patient did not want to proceed with the recommendations and sought second opinion.  I do not have the above results available for my review.  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  She was physically active prior to the pandemic, but has reduced physical activity in the last year or so.     Current Outpatient Medications on File Prior to Visit  Medication Sig Dispense Refill  . acetaminophen (TYLENOL) 500 MG tablet Take 1,000 mg by mouth every 6 (six) hours as needed for moderate pain or headache. (Patient not taking: Reported on 01/16/2020)    . albuterol (PROVENTIL HFA;VENTOLIN HFA) 108 (90 BASE) MCG/ACT inhaler Inhale 2 puffs into the lungs every 6 (six) hours as needed for wheezing or shortness of breath.     . amphetamine-dextroamphetamine (ADDERALL) 20 MG tablet Take 20 mg by mouth daily.    . Azelastine-Fluticasone (DYMISTA) 137-50 MCG/ACT SUSP Place into the nose.    . Calcium Carbonate-Simethicone (TUMS GAS RELIEF CHEWY BITES) 750-80 MG CHEW Chew 2  tablets by mouth 3 (three) times daily as needed (for acid reflux). (Patient not taking: Reported on 01/16/2020)    . cycloSPORINE (RESTASIS) 0.05 % ophthalmic emulsion Place 2 drops into both eyes 2 (two) times daily.    . diclofenac Sodium (VOLTAREN) 1 % GEL Place onto the skin daily as needed. (Patient not taking: Reported on 01/16/2020)    . EPINEPHrine 0.3 mg/0.3 mL IJ SOAJ injection Inject 0.3 mg into the muscle once.    Marland Kitchen levocetirizine (XYZAL) 5 MG tablet Take 5 mg by mouth every evening.    Marland Kitchen OVER THE COUNTER MEDICATION Take 1 capsule by mouth daily. Nordic Naturals Omega Fish [employer]    . Prenatal Vit-Fe Fumarate-FA (MULTIVITAMIN-PRENATAL) 27-0.8 MG TABS tablet Take 1 tablet by mouth daily.     . Turmeric (QC TUMERIC COMPLEX PO) Take 1 tablet by mouth daily.    Marland Kitchen VITAMIN D, CHOLECALCIFEROL, PO Take 1 tablet by mouth daily.    . [DISCONTINUED] etonogestrel (NEXPLANON) 68 MG IMPL implant 1 each by Subdermal route once.     No current facility-administered medications on file prior to visit.    Cardiovascular and other pertinent studies:  Exercise treadmill stress test 04/08/2020: Exercise treadmill stress test performed using Bruce protocol.  Patient reached 10.9 METS, and 102% of age predicted maximum heart rate.  Exercise capacity was excellent.  No chest pain reported.  Normal heart rate and hemodynamic response. Stress EKG revealed no ischemic changes. Patrick risk study.   EKG 01/16/2020: Sinus rhythm 63 bpm Normal  EKG  Recent labs: 01/09/2020 Glucose 97, BUN/Cr 12/1.0. EGFR 88. Na/K 140/4.5.  H/H 12.9/38.6. MCV 97.6. Platelets 241 Chol 150, TG 46, HDL 52, LDL 88 HbA1C 5.9% TSH 1.3 normal   Review of Systems  Cardiovascular: Positive for chest pain and palpitations. Negative for dyspnea on exertion, leg swelling and syncope.        *** There were no vitals filed for this visit.  *** There is no height or weight on file to calculate BMI. There were no vitals  filed for this visit.   Objective:   Physical Exam Vitals and nursing note reviewed.  Constitutional:      General: She is not in acute distress. Neck:     Vascular: No JVD.  Cardiovascular:     Rate and Rhythm: Normal rate and regular rhythm.     Pulses: Intact distal pulses.     Heart sounds: Normal heart sounds. No murmur heard.   Pulmonary:     Effort: Pulmonary effort is normal.     Breath sounds: Normal breath sounds. No wheezing or rales.         Assessment & Recommendations:   29 y.o. African-American female with exercise-induced asthma, former smoker, family history of early CAD, presenting with chest pain, palpitations.  Chest pain: Risk factors include family history of early CAD.  Chest pain is exertional, although resting EKG is normal.  Recommend exercise treadmill stress test. Given her family history of early CAD, recommend calcium score scan  Palpitations: Reported history of SVT, although reports not available for my review.  I will obtain echocardiogram and Zio patch records from Dr. Zenia Resides office.  Discussed vagal maneuvers with the patient.  Given infrequent and short lasting nature of her symptoms, I doubt she will need ablation at this time.  Given her history of exercise-induced asthma, I would avoid beta-blockers.  I offered as needed diltiazem prescription, but patient would like to hold off at this time.  Further recommendations after stress testing and reviewing outside records.     Thank you for referring the patient to [place]. Please feel free to contact with any questions.  Nigel Mormon, MD [facility]. [place] Pager: [phone] Office: [phone]

## 2020-07-10 ENCOUNTER — Ambulatory Visit: Payer: BC Managed Care – PPO | Admitting: Cardiology

## 2020-07-17 ENCOUNTER — Encounter: Payer: Self-pay | Admitting: Cardiology

## 2020-07-17 ENCOUNTER — Ambulatory Visit: Payer: BC Managed Care – PPO | Admitting: Cardiology

## 2020-07-17 ENCOUNTER — Other Ambulatory Visit: Payer: Self-pay

## 2020-07-17 VITALS — BP 114/62 | HR 71 | Ht 65.0 in | Wt 241.0 lb

## 2020-07-17 DIAGNOSIS — R911 Solitary pulmonary nodule: Secondary | ICD-10-CM

## 2020-07-17 DIAGNOSIS — R002 Palpitations: Secondary | ICD-10-CM

## 2020-07-17 NOTE — Progress Notes (Signed)
Patient referred by [name], MD for irregular heartbeat  Subjective:   Amanda Patrick, female    DOB: [birth year], 29 y.o.   MRN: [id number]   Chief Complaint  Patient presents with  . Palpitations  . Results     HPI  29 y.o. African-American female with exercise-induced asthma, former smoker, family history of early CAD, presenting with chest pain, palpitations.  Cardiac work-up unremarkable, details below.  Since last visit with me, she underwent emergency appendicectomy.  She has had only occasional palpitation symptoms.  Initial consultation HPI 12/2019: Complains of retrosternal chest pain with exertion, which improves with rest.  He also notices palpitations lasting for few seconds that "catch her breath" from time to time.  Patient previously wore a Holter monitor in 2018 that showed PACs and PVCs.  Recently, she saw Dr. Terrence Dupont and wore 2 weeks Zio patch.  Per history, patient was told to have SVT and was recommended either beta-blocker or ablation.  He was also found to have mild tricuspid regurgitation on echocardiogram.  Patient did not want to proceed with the recommendations and sought second opinion.  I do not have the above results available for my review.  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  She was physically active prior to the pandemic, but has reduced physical activity in the last year or so.     Current Outpatient Medications on File Prior to Visit  Medication Sig Dispense Refill  . acetaminophen (TYLENOL) 500 MG tablet Take 1,000 mg by mouth every 6 (six) hours as needed for moderate pain or headache. (Patient not taking: Reported on 01/16/2020)    . albuterol (PROVENTIL HFA;VENTOLIN HFA) 108 (90 BASE) MCG/ACT inhaler Inhale 2 puffs into the lungs every 6 (six) hours as needed for wheezing or shortness of breath.     . amphetamine-dextroamphetamine (ADDERALL) 20 MG tablet Take 20 mg by mouth daily.    .  Azelastine-Fluticasone (DYMISTA) 137-50 MCG/ACT SUSP Place into the nose.    . Calcium Carbonate-Simethicone (TUMS GAS RELIEF CHEWY BITES) 750-80 MG CHEW Chew 2 tablets by mouth 3 (three) times daily as needed (for acid reflux). (Patient not taking: Reported on 01/16/2020)    . cycloSPORINE (RESTASIS) 0.05 % ophthalmic emulsion Place 2 drops into both eyes 2 (two) times daily.    . diclofenac Sodium (VOLTAREN) 1 % GEL Place onto the skin daily as needed. (Patient not taking: Reported on 01/16/2020)    . EPINEPHrine 0.3 mg/0.3 mL IJ SOAJ injection Inject 0.3 mg into the muscle once.    Marland Kitchen levocetirizine (XYZAL) 5 MG tablet Take 5 mg by mouth every evening.    Marland Kitchen OVER THE COUNTER MEDICATION Take 1 capsule by mouth daily. Nord[employer]aturals Omega Fish [employer]    . Prenatal Vit-Fe Fumarate-FA (MULTIVITAMIN-PRENATAL) 27-0.8 MG TABS tablet Take 1 tablet by mouth daily.     . Turmeric (QC TUMERIC COMPLEX PO) Take 1 tablet by mouth daily.    Marland Kitchen VITAMIN D, CHOLECALCIFEROL, PO Take 1 tablet by mouth daily.    . [DISCONTINUED] etonogestrel (NEXPLANON) 68 MG IMPL implant 1 each by Subdermal route once.     No current facility-administered medications on file prior to visit.    Cardiovascular and other pertinent studies:  Exercise treadmill stress test 04/08/2020: Exercise treadmill stress test performed using Bruce protocol.  Patient reached 10.9 METS, and 102% of age predicted maximum heart rate.  Exercise capacity was excellent.  No chest pain reported.  Normal heart rate and hemodynamic response. Stress EKG revealed no ischemic changes. Low risk study.  CT cardiac scoring 01/2020: Calcium score: 0 Potential 4 mm nodule in RML  EKG 01/16/2020: Sinus rhythm 63 bpm Normal EKG  Echocardiogram 09/29/2019: EF 55-60% Mild TR  Zio patch 09/2019: Rare PAC/PVC  Recent labs: 01/09/2020 Glucose 97, BUN/Cr 12/1.0. EGFR 88. Na/K 140/4.5.  H/H 12.9/38.6. MCV 97.6. Platelets 241 Chol 150, TG 46, HDL 52, LDL  88 HbA1C 5.9% TSH 1.3 normal   Review of Systems  Cardiovascular: Positive for chest pain and palpitations. Negative for dyspnea on exertion, leg swelling and syncope.         Vitals:   07/17/20 1514  BP: 114/62  Pulse: 71  SpO2: 97%     Body mass index is 40.1 kg/m. Filed Weights   07/17/20 1514  Weight: 241 lb (109.3 kg)     Objective:   Physical Exam Vitals and nursing note reviewed.  Constitutional:      General: She is not in acute distress. Neck:     Vascular: No JVD.  Cardiovascular:     Rate and Rhythm: Normal rate and regular rhythm.     Pulses: Intact distal pulses.     Heart sounds: Normal heart sounds. No murmur heard.   Pulmonary:     Effort: Pulmonary effort is normal.     Breath sounds: Normal breath sounds. No wheezing or rales.         Assessment & Recommendations:   29 y.o. African-American female with exercise-induced asthma, former smoker, family history of early CAD, occasional palpitations  Chest pain: Noncardiac.  Calcium score 0.  Normal exercise treadmill stress test.  Normal echocardiogram.    Palpitations: Occasional.  <1% PACs, PVCs seen on monitor in March 2021.  No medication necessary at this time.    Lung nodule:  Incidental finding of possible 4 mm lung nodule on CT scan 01/2020.  Given her prior history of smoking, may need follow-up.  Defer to PCP.   Follow-up as needed  Nigel Mormon, MD [facility] Pager: [phone] Office: [phone]

## 2020-08-16 ENCOUNTER — Other Ambulatory Visit: Payer: Self-pay | Admitting: Pulmonary Disease

## 2020-08-16 ENCOUNTER — Other Ambulatory Visit: Payer: Self-pay | Admitting: Obstetrics and Gynecology

## 2020-08-16 ENCOUNTER — Other Ambulatory Visit: Payer: Self-pay

## 2020-08-16 DIAGNOSIS — N644 Mastodynia: Secondary | ICD-10-CM

## 2020-08-16 DIAGNOSIS — N6320 Unspecified lump in the left breast, unspecified quadrant: Secondary | ICD-10-CM

## 2020-08-21 ENCOUNTER — Telehealth: Payer: Self-pay | Admitting: Genetic Counselor

## 2020-08-21 NOTE — Telephone Encounter (Signed)
Received a genetic counseling referral from Dr. Lysle Rubens for brca. Pt has been cld and scheduled to see Cari via mychart video on 2/7 at 9am. I verified the pt has an active mychart acct.

## 2020-08-26 ENCOUNTER — Inpatient Hospital Stay: Payer: Medicaid Other

## 2020-08-26 ENCOUNTER — Other Ambulatory Visit: Payer: Self-pay

## 2020-08-26 ENCOUNTER — Encounter: Payer: Self-pay | Admitting: Licensed Clinical Social Worker

## 2020-08-26 ENCOUNTER — Inpatient Hospital Stay: Payer: Medicaid Other | Attending: Genetic Counselor | Admitting: Licensed Clinical Social Worker

## 2020-08-26 ENCOUNTER — Other Ambulatory Visit: Payer: Self-pay | Admitting: Licensed Clinical Social Worker

## 2020-08-26 DIAGNOSIS — Z800 Family history of malignant neoplasm of digestive organs: Secondary | ICD-10-CM

## 2020-08-26 DIAGNOSIS — Z8481 Family history of carrier of genetic disease: Secondary | ICD-10-CM | POA: Diagnosis not present

## 2020-08-26 DIAGNOSIS — Z803 Family history of malignant neoplasm of breast: Secondary | ICD-10-CM

## 2020-08-26 DIAGNOSIS — Z8042 Family history of malignant neoplasm of prostate: Secondary | ICD-10-CM

## 2020-08-26 NOTE — Progress Notes (Signed)
REFERRING PROVIDER: Ma Hillock, [address],  [place] [address]  PRIMARY PROVIDER:  [name], MD  PRIMARY REASON FOR VISIT:  1. Family history of breast cancer   2. Family history of pancreatic cancer   3. Family history of colon cancer   4. Family history of prostate cancer   5. Family history of gene mutation    I connected with Amanda Patrick on 08/26/2020 at 9:00 AM EDT by MyChart video conference and verified that I am speaking with the correct person using two identifiers.    Patient location: home Provider location: [place]:   [name], a 30 y.o. female, was seen for a [place] cancer genetics consultation due to a family history of cancer.  Amanda Patrick presents to clinic today to discuss the possibility of a hereditary predisposition to cancer, genetic testing, and to further clarify her future cancer risks, as well as potential cancer risks for family members.    Amanda Patrick is a 30 y.o. female with no personal history of cancer.    CANCER HISTORY:  Oncology History   No history exists.     RISK FACTORS:  Menarche was at age 36.  First live birth at age 51.  OCP use for approximately 3 years.  Ovaries intact: yes.  Hysterectomy: no.  Menopausal status: premenopausal.  HRT use: 0 years. Colonoscopy: no; not examined. Mammogram within the last year: having one today. Number of breast biopsies: 0. Up to date with pelvic exams: yes. Any excessive radiation exposure in the past: no  Past Medical History:  Diagnosis Date  . ADD (attention deficit disorder)   . Anxiety    nervous type discomfort - recent and "weird"  . Anxiety   . Arthritis    rheumatoid  . Asthma   . Dysrhythmia    hx 'butterflies in chest'  . Family history of breast cancer   . Family history of colon cancer   . Family history of gene mutation   . Family history of pancreatic cancer   . Family  history of prostate cancer   . GERD (gastroesophageal reflux disease)    uses tums  . Headache    h/o migraine  last flareup in 04/2017 _ stays clear of triggers  . HOH (hard of hearing)    slight decrease in hearing, left is better than right.   doesn't need aides at this time  . Hyperlipidemia   . Scalp mass   . Vitamin D deficiency     Past Surgical History:  Procedure Laterality Date  . ADENOIDECTOMY    . APPENDECTOMY  02/16/2020  . CESAREAN SECTION  02/07/2011   Procedure: CESAREAN SECTION;  Surgeon: Catha Brow;  Location: [facility];  Service: Gynecology;  Laterality: N/A;  Primary cesarean section with delivery of baby boy at 34.Apgars 9/9.  Marland Kitchen MASS EXCISION N/A 07/15/2017   Procedure: EXCISION SCALP MASS ERAS PATHWAY;  Surgeon: Coralie Keens, MD;  Location: [facility];  Service: General;  Laterality: N/A;  . TENDON REPAIR     left tibial tendon  . TONSILLECTOMY AND ADENOIDECTOMY  2010    Social History   Socioeconomic History  . Marital status: Single    Spouse name: Not on file  . Number of children: 1  . Years of education: Not on file  . Highest education level: Not on file  Occupational History  . Not on file  Tobacco Use  .  Smoking status: Former Smoker    Packs/day: 0.25    Years: 7.00    Pack years: 1.75    Types: Cigarettes    Quit date: 2017    Years since quitting: 5.1  . Smokeless tobacco: Never Used  Vaping Use  . Vaping Use: Never used  Substance and Sexual Activity  . Alcohol use: Yes    Comment: with holidays  . Drug use: No  . Sexual activity: Yes  Other Topics Concern  . Not on file  Social History Narrative  . Not on file   Social Determinants of Health   Financial Resource Strain: Not on file  Food Insecurity: Not on file  Transportation Needs: Not on file  Physical Activity: Not on file  Stress: Not on file  Social Connections: Not on file     FAMILY HISTORY:  We obtained a detailed, 4-generation family history.  Significant  diagnoses are listed below: Family History  Problem Relation Age of Onset  . Hypertension Father   . CAD Father        MI in 49s  . Anxiety disorder Sister   . Depression Brother   . Other Paternal Aunt        pos. genetic testing  . Colon cancer Paternal Uncle 32  . Prostate cancer Paternal Uncle 69  . Breast cancer Paternal Grandmother        x2  . Lymphoma Paternal Grandmother   . Pancreatic cancer Other   . Pancreatic cancer Other   . Breast cancer Other    Amanda Patrick has one son. She has 1 brother and 1 sister, no cancers.   Amanda Patrick mother is living at 24, no cancer history for her or on this sie of the family.   Amanda Patrick father is 70. Patient has 2 paternal aunts and 1 uncle. Her uncle had prostate cancer and then colon cancer diagnosed in his 47s and died at 76. Her aunts reportedly had positive genetic testing, gene/report not available at this time but patient will try to get a copy. Paternal grandfather died of ALS. Grandmother had breast cancer twice and lymphoma and all of her siblings had cancer- a sister with pancreatic, brother with pancreatic, two sisters with breast, a brother with liver, and their mother, patient's paternal great grandmother, also had breast.   Amanda Patrick is aware of previous family history of genetic testing for hereditary cancer risks. Patient's maternal ancestors are of unknown/[employer] descent, and paternal ancestors are of unknown/[place]/Asian descent. There is no reported Ashkenazi Jewish ancestry. There is no known consanguinity.    GENETIC COUNSELING ASSESSMENT: Amanda Patrick is a 30 y.o. female with a family history which is somewhat suggestive of a hereditary cancer syndrome and predisposition to cancer. We, therefore, discussed and recommended the following at today's visit.   DISCUSSION: We discussed that approximately 5-10% of breast cancer is hereditary.  Most cases of hereditary  breast cancer are associated with BRCA1/BRCA2 genes, although there are other genes associated with hereditary  cancer as well. It is possible that her aunts tested positive for BRCA1/BRCA2, but they may have also tested positive for a different gene . We discussed that testing is beneficial for several reasons including  knowing about  cancer risks, identifying potential screening and risk-reduction options that may be appropriate, and to understand if other family members could be at risk for cancer and allow them to undergo genetic testing.   We reviewed the characteristics, features and  inheritance patterns of hereditary cancer syndromes. We also discussed genetic testing, including the appropriate family members to test, the process of testing, insurance coverage and turn-around-time for results. We discussed the implications of a negative, positive and/or variant of uncertain significant result. We recommended Ms. Le Claire pursue genetic testing for the [facility] Multi-Cancer Panel +RNA  gene panel.   The Multi-Cancer Panel offered by [facility] includes sequencing and/or deletion duplication testing of the following 84 genes: AIP, ALK, APC, ATM, AXIN2,BAP1,  BARD1, BLM, BMPR1A, BRCA1, BRCA2, BRIP1, CASR, CDC73, CDH1, CDK4, CDKN1B, CDKN1C, CDKN2A (p14ARF), CDKN2A (p16INK4a), CEBPA, CHEK2, CTNNA1, DICER1, DIS3L2, EGFR (c.2369C>T, p.Thr790Met variant only), EPCAM (Deletion/duplication testing only), FH, FLCN, GATA2, GPC3, GREM1 (Promoter region deletion/duplication testing only), HOXB13 (c.251G>A, p.Gly84Glu), HRAS, KIT, MAX, MEN1, MET, MITF (c.952G>A, p.Glu318Lys variant only), MLH1, MSH2, MSH3, MSH6, MUTYH, NBN, NF1, NF2, NTHL1, PALB2, PDGFRA, PHOX2B, PMS2, POLD1, POLE, POT1, PRKAR1A, PTCH1, PTEN, RAD50, RAD51C, RAD51D, RB1, RECQL4, RET, RUNX1, SDHAF2, SDHA (sequence changes only), SDHB, SDHC, SDHD, SMAD4, SMARCA4, SMARCB1, SMARCE1, STK11, SUFU, TERC, TERT, TMEM127, TP53, TSC1, TSC2, VHL, WRN and WT1.    Based on Ms. Keithly's family history of cancer, she meets medical criteria for genetic testing. Despite that she meets criteria, she may still have an out of pocket cost.  PLAN: After considering the risks, benefits, and limitations, Ms. Beverly provided informed consent to pursue genetic testing and the blood sample was sent to [facility] for analysis of the Multi-Cancer Panel+RNA. Results should be available within approximately 2-3 weeks' time, at which point they will be disclosed by telephone to Ms. [place], as will any additional recommendations warranted by these results. Ms. Delisi will receive a summary of her genetic counseling visit and a copy of her results once available. This information will also be available in Epic.   Ms. Mulligan questions were answered to her satisfaction today. Our contact information was provided should additional questions or concerns arise. Thank you for the referral and allowing [place] to share in the care of your patient.   Faith Rogue, MS, [facility] Genetic Counselor [email or web address] Phone: [phone]  The patient was seen for a total of 45 minutes in virtual genetic counseling. [employer] Intern Fredrik Rigger was present and assisted with this case.  Dr. Grayland Ormond was available for discussion regarding this case.   _______________________________________________________________________ For Office Staff:  Number of people involved in session: 2 Was an Intern/ student involved with case: yes

## 2020-08-26 NOTE — Progress Notes (Signed)
error 

## 2020-09-23 ENCOUNTER — Telehealth: Payer: Self-pay | Admitting: Licensed Clinical Social Worker

## 2020-09-25 ENCOUNTER — Encounter: Payer: Self-pay | Admitting: Licensed Clinical Social Worker

## 2020-09-25 ENCOUNTER — Ambulatory Visit: Payer: Self-pay | Admitting: Licensed Clinical Social Worker

## 2020-09-25 DIAGNOSIS — Z803 Family history of malignant neoplasm of breast: Secondary | ICD-10-CM

## 2020-09-25 DIAGNOSIS — Z1379 Encounter for other screening for genetic and chromosomal anomalies: Secondary | ICD-10-CM

## 2020-09-25 DIAGNOSIS — Z800 Family history of malignant neoplasm of digestive organs: Secondary | ICD-10-CM

## 2020-09-25 DIAGNOSIS — Z8481 Family history of carrier of genetic disease: Secondary | ICD-10-CM

## 2020-09-25 DIAGNOSIS — Z8042 Family history of malignant neoplasm of prostate: Secondary | ICD-10-CM

## 2020-09-25 NOTE — Telephone Encounter (Signed)
Revealed negative genetic testing.  Revealed that a VUS in AXIN2 and BRIP1 were identified. This normal result is reassuring. It is unlikely that there is an increased risk of cancer due to a mutation in one of these genes.  However, genetic testing is not perfect, and cannot definitively rule out a hereditary cause.  It will be important for her to keep in contact with genetics to learn if any additional testing may be needed in the future.

## 2020-09-25 NOTE — Progress Notes (Addendum)
HPI:  [name] was previously seen in the [place] clinic due to a family history of cancer and concerns regarding a hereditary predisposition to cancer. Please refer to our prior cancer genetics clinic note for more information regarding our discussion, assessment and recommendations, at the time. [name] recent genetic test results were disclosed to her, as were recommendations warranted by these results. These results and recommendations are discussed in more detail below.  CANCER HISTORY:  Oncology History   No history exists.    FAMILY HISTORY:  We obtained a detailed, 4-generation family history.  Significant diagnoses are listed below: Family History  Problem Relation Age of Onset   Hypertension Father    CAD Father        MI in 35s   Anxiety disorder Sister    Depression Brother    Other Paternal Aunt        pos. genetic testing   Colon cancer Paternal Uncle 85   Prostate cancer Paternal Uncle 75   Breast cancer Paternal Grandmother        x2   Lymphoma Paternal Grandmother    Pancreatic cancer Other    Pancreatic cancer Other    Breast cancer Other     [name] has one son. She has 1 brother and 1 sister, no cancers.    [name] mother is living at 35, no cancer history for her or on this sie of the family.    [name] father is 55. Patient has 2 paternal aunts and 1 uncle. Her uncle had prostate cancer and then colon cancer diagnosed in his 51s and died at 89. Her aunts reportedly had positive genetic testing, gene/report not available at this time but patient will try to get a copy. Paternal grandfather died of ALS. Grandmother had breast cancer twice and lymphoma and all of her siblings had cancer- a sister with pancreatic, brother with pancreatic, two sisters with breast, a brother with liver, and their mother, patient's paternal great grandmother, also had breast.    [name] is aware of previous family  history of genetic testing for hereditary cancer risks. Patient's maternal ancestors are of unknown/[employer] descent, and paternal ancestors are of unknown/[place]/Asian descent. There is no reported Ashkenazi Jewish ancestry. There is no known consanguinity.       GENETIC TEST RESULTS: Genetic testing reported out on 09/21/2020 through the [facility] Multi- cancer panel found no pathogenic mutations.   The Multi-Cancer Panel+RNA offered by [facility] includes sequencing and/or deletion duplication testing of the following 85 genes: AIP, ALK, APC, ATM, AXIN2,BAP1,  BARD1, BLM, BMPR1A, BRCA1, BRCA2, BRIP1, CASR, CDC73, CDH1, CDK4, CDKN1B, CDKN1C, CDKN2A (p14ARF), CDKN2A (p16INK4a), CEBPA, CHEK2, CTNNA1, DICER1, DIS3L2, EGFR (c.2369C>T, p.Thr790Met variant only), EPCAM (Deletion/duplication testing only), FH, FLCN, GATA2, GPC3, GREM1 (Promoter region deletion/duplication testing only), HOXB13 (c.251G>A, p.Gly84Glu), HRAS, KIT, MAX, MEN1, MET, MITF (c.952G>A, p.Glu318Lys variant only), MLH1, MSH2, MSH3, MSH6, MUTYH, NBN, NF1, NF2, NTHL1, PALB2, PDGFRA, PHOX2B, PMS2, POLD1, POLE, POT1, PRKAR1A, PTCH1, PTEN, RAD50, RAD51C, RAD51D, RB1, RECQL4, RET, RUNX1, SDHAF2, SDHA (sequence changes only), SDHB, SDHC, SDHD, SMAD4, SMARCA4, SMARCB1, SMARCE1, STK11, SUFU, TERC, TERT, TMEM127, TP53, TSC1, TSC2, VHL, WRN and WT1.   The test report has been scanned into EPIC and is located under the Molecular Pathology section of the Results Review tab.  A portion of the result report is included below for reference.     We discussed with [name] that because current genetic testing is  not perfect, it is possible there may be a gene mutation in one of these genes that current testing cannot detect, but that chance is small.  We also discussed, that there could be another gene that has not yet been discovered, or that we have not yet tested, that is responsible for the cancer diagnoses in the family.  It is also possible there is a hereditary cause for the cancer in the family that [name] did not inherit and therefore was not identified in her testing.  Therefore, it is important to remain in touch with cancer genetics in the future so that we can continue to offer [name] the most up to date genetic testing.   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  At this time, it is unknown if these variants are associated with increased cancer risk or if they are normal findings, but most variants such as these get reclassified to being inconsequential. They should not be used to make medical management decisions. With time, we suspect the lab will determine the significance of these variants, if any. If we do learn more about them, we will try to contact [name] to discuss it further. However, it is important to stay in touch with [place] periodically and keep the address and phone number up to date.  UPDATE:VUS in Amanda Patrick called c.1948C>T has been reclassified to "Likely Benign." The amended report date is 02/26/2022.  ADDITIONAL GENETIC TESTING: We discussed with [name] that her genetic testing was fairly extensive.  If there are genes identified to increase cancer risk that can be analyzed in the future, we would be happy to discuss and coordinate this testing at that time.    CANCER SCREENING RECOMMENDATIONS: [name] test result is considered negative (normal).  This means that we have not identified a hereditary cause for her family history of cancer at this time.   While reassuring, this does not definitively rule out a hereditary predisposition to cancer. It is still possible that there could be genetic mutations that are undetectable by current technology. There could be genetic mutations in genes that have not been tested or identified to increase cancer risk.  Therefore, it is recommended  she continue to follow the cancer management and screening guidelines provided by her  primary healthcare provider.   An individual's cancer risk and medical management are not determined by genetic test results alone. Overall cancer risk assessment incorporates additional factors, including personal medical history, family history, and any available genetic information that may result in a personalized plan for cancer prevention and surveillance.  Based on [name]'s personal and family history of cancer as well as her genetic test results, risk model Harriett Rush was used to estimate her risk of developing breast cancer. This estimates her lifetime risk of developing breast cancer to be approximately 13%.  The patient's lifetime breast cancer risk is a preliminary estimate based on available information using one of several models endorsed by the [place] (ACS). The ACS recommends consideration of breast MRI screening as an adjunct to mammography for patients at high risk (defined as 20% or greater lifetime risk).    RECOMMENDATIONS FOR FAMILY MEMBERS:  Relatives in this family might be at some increased risk of developing cancer, over the general population risk, simply due to the family history of cancer.  We recommended female relatives in this family have a yearly  mammogram beginning at age 54, or 23 years younger than the earliest onset of cancer, an annual clinical breast exam, and perform monthly breast self-exams. Female relatives in this family should also have a gynecological exam as recommended by their primary provider.  All family members should be referred for colonoscopy starting at age 60.    It is also possible there is a hereditary cause for the cancer in [name]'s family that she did not inherit and therefore was not identified in her.  Based on [name]'s family history, we recommended paternal relatives have genetic counseling and testing. Ms.  Amanda Patrick will let us know if we can be of any assistance in coordinating genetic counseling and/or testing for these family members.  FOLLOW-UP: Lastly, we discussed with [name] that cancer genetics is a rapidly advancing field and it is possible that new genetic tests will be appropriate for her and/or her family members in the future. We encouraged her to remain in contact with cancer genetics on an annual basis so we can update her personal and family histories and let her know of advances in cancer genetics that may benefit this family.   Our contact number was provided. [name] questions were answered to her satisfaction, and she knows she is welcome to call us at anytime with additional questions or concerns.   Faith Rogue, MS, [facility] Genetic Counselor [email or web address] Phone: [phone]

## 2020-10-01 ENCOUNTER — Other Ambulatory Visit: Payer: BC Managed Care – PPO

## 2021-02-27 ENCOUNTER — Other Ambulatory Visit: Payer: Self-pay | Admitting: Internal Medicine

## 2021-02-27 DIAGNOSIS — S301XXD Contusion of abdominal wall, subsequent encounter: Secondary | ICD-10-CM

## 2021-03-05 DIAGNOSIS — F418 Other specified anxiety disorders: Secondary | ICD-10-CM | POA: Diagnosis not present

## 2021-03-05 DIAGNOSIS — Z9149 Other personal history of psychological trauma, not elsewhere classified: Secondary | ICD-10-CM | POA: Diagnosis not present

## 2021-03-07 ENCOUNTER — Ambulatory Visit
Admission: RE | Admit: 2021-03-07 | Discharge: 2021-03-07 | Disposition: A | Discharge status: Home / Self Care | Payer: Medicaid Other | Source: Ambulatory Visit | Attending: Internal Medicine | Admitting: Internal Medicine

## 2021-03-07 DIAGNOSIS — S301XXD Contusion of abdominal wall, subsequent encounter: Secondary | ICD-10-CM

## 2021-03-07 MED ORDER — IOPAMIDOL (ISOVUE-300) INJECTION 61%
100.0000 mL | Freq: Once | INTRAVENOUS | Status: AC | PRN
  Administered 2021-03-07: 100 mL via INTRAVENOUS

## 2021-03-20 DIAGNOSIS — R1011 Right upper quadrant pain: Secondary | ICD-10-CM | POA: Diagnosis not present

## 2021-03-21 DIAGNOSIS — R1011 Right upper quadrant pain: Secondary | ICD-10-CM | POA: Diagnosis not present

## 2021-03-21 DIAGNOSIS — Z9049 Acquired absence of other specified parts of digestive tract: Secondary | ICD-10-CM | POA: Diagnosis not present

## 2021-03-25 DIAGNOSIS — F418 Other specified anxiety disorders: Secondary | ICD-10-CM | POA: Diagnosis not present

## 2021-03-25 DIAGNOSIS — Z9149 Other personal history of psychological trauma, not elsewhere classified: Secondary | ICD-10-CM | POA: Diagnosis not present

## 2021-04-07 DIAGNOSIS — R42 Dizziness and giddiness: Secondary | ICD-10-CM | POA: Diagnosis not present

## 2021-04-07 DIAGNOSIS — R1011 Right upper quadrant pain: Secondary | ICD-10-CM | POA: Diagnosis not present

## 2021-04-07 DIAGNOSIS — Z9049 Acquired absence of other specified parts of digestive tract: Secondary | ICD-10-CM | POA: Diagnosis not present

## 2021-04-07 DIAGNOSIS — H5500 Unspecified nystagmus: Secondary | ICD-10-CM | POA: Diagnosis not present

## 2021-04-07 DIAGNOSIS — J029 Acute pharyngitis, unspecified: Secondary | ICD-10-CM | POA: Diagnosis not present

## 2021-04-07 DIAGNOSIS — R3121 Asymptomatic microscopic hematuria: Secondary | ICD-10-CM | POA: Diagnosis not present

## 2021-04-07 DIAGNOSIS — Z3202 Encounter for pregnancy test, result negative: Secondary | ICD-10-CM | POA: Diagnosis not present

## 2021-04-08 DIAGNOSIS — Z9149 Other personal history of psychological trauma, not elsewhere classified: Secondary | ICD-10-CM | POA: Diagnosis not present

## 2021-04-08 DIAGNOSIS — F418 Other specified anxiety disorders: Secondary | ICD-10-CM | POA: Diagnosis not present

## 2021-04-28 DIAGNOSIS — R1031 Right lower quadrant pain: Secondary | ICD-10-CM | POA: Diagnosis not present

## 2021-04-28 DIAGNOSIS — R1011 Right upper quadrant pain: Secondary | ICD-10-CM | POA: Diagnosis not present

## 2021-05-05 ENCOUNTER — Ambulatory Visit: Payer: BC Managed Care – PPO | Admitting: Student

## 2021-05-05 ENCOUNTER — Encounter: Payer: Self-pay | Admitting: Student

## 2021-05-05 ENCOUNTER — Ambulatory Visit: Payer: Medicaid Other | Admitting: Student

## 2021-05-05 ENCOUNTER — Other Ambulatory Visit: Payer: Self-pay

## 2021-05-05 VITALS — BP 120/68 | HR 87 | Temp 97.7°F | Ht 65.0 in | Wt 220.0 lb

## 2021-05-05 DIAGNOSIS — R002 Palpitations: Secondary | ICD-10-CM

## 2021-05-05 DIAGNOSIS — R072 Precordial pain: Secondary | ICD-10-CM | POA: Diagnosis not present

## 2021-05-05 DIAGNOSIS — I491 Atrial premature depolarization: Secondary | ICD-10-CM

## 2021-05-05 NOTE — Progress Notes (Signed)
Patient referred by [name], MD for irregular heartbeat  Subjective:   Amanda Patrick, female    DOB: 10/19/1990, 29 y.o.   MRN: [id number]   Chief Complaint  Patient presents with   Chest Pain   Palpitations   Follow-up     HPI  30 y.o. African-American female with exercise-induced asthma, former smoker, family history of early CAD.   Patient was last seen in our office by Dr. Virgina Jock 07/17/2020 at which time she was advised for as needed follow-up given unremarkable cardiac work-up.  Patient now presents with concerns of chest pain and increased frequency of palpitations over the last few weeks.  She has also had multiple ED visits and hospital admission for noncardiac issues including cholecystectomy and emergency D&C.  She was evaluated in an outside emergency department 01/09/2021 with chest pain at which time troponins were negative, EKG without ischemic changes, BNP normal, overall unremarkable work-up.  Initial consultation HPI 12/2019: Complains of retrosternal chest pain with exertion, which improves with rest.  He also notices palpitations lasting for few seconds that "catch her breath" from time to time.  Patient previously wore a Holter monitor in 2018 that showed PACs and PVCs.  Recently, she saw Dr. Terrence Dupont and wore 2 weeks Zio patch.  Per history, patient was told to have SVT and was recommended either beta-blocker or ablation.  He was also found to have mild tricuspid regurgitation on echocardiogram.  Patient did not want to proceed with the recommendations and sought second opinion.  I do not have the above results available for my review.  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  She was physically active prior to the pandemic, but has reduced physical activity in the last year or so.   Current Outpatient Medications on File Prior to Visit  Medication Sig Dispense Refill   albuterol (PROVENTIL HFA;VENTOLIN HFA)  108 (90 BASE) MCG/ACT inhaler Inhale 2 puffs into the lungs every 6 (six) hours as needed for wheezing or shortness of breath.      amphetamine-dextroamphetamine (ADDERALL) 20 MG tablet Take 20 mg by mouth daily.     Azelastine-Fluticasone 137-50 MCG/ACT SUSP Place into the nose.     Calcium Carbonate-Simethicone 750-80 MG CHEW Chew 2 tablets by mouth 3 (three) times daily as needed (for acid reflux).     cycloSPORINE (RESTASIS) 0.05 % ophthalmic emulsion Place 2 drops into both eyes 2 (two) times daily.     EPINEPHrine 0.3 mg/0.3 mL IJ SOAJ injection Inject 0.3 mg into the muscle once.     levocetirizine (XYZAL) 5 MG tablet Take 5 mg by mouth every evening.     Baclofen 5 MG TABS Take 5 mg by mouth 3 times/day as needed-between meals & bedtime. (Patient not taking: Reported on 05/05/2021)     [DISCONTINUED] etonogestrel (NEXPLANON) 68 MG IMPL implant 1 each by Subdermal route once.     No current facility-administered medications on file prior to visit.    Cardiovascular and other pertinent studies: EKG 05/05/2021:  Normal sinus rhythm at a rate of 81 bpm.  Normal axis.  No evidence of ischemia or underlying injury pattern.  Exercise treadmill stress test 04/08/2020: Exercise treadmill stress test performed using Bruce protocol.  Patient reached 10.9 METS, and 102% of age predicted maximum heart rate.  Exercise capacity was excellent.  No chest pain reported.  Normal heart rate and hemodynamic response. Stress EKG revealed no ischemic changes. Low risk study.  CT  cardiac scoring 01/2020: Calcium score: 0 Potential 4 mm nodule in RML  EKG 01/16/2020: Sinus rhythm 63 bpm Normal EKG  Echocardiogram 09/29/2019: EF 55-60% Mild TR  Zio patch 09/2019: Rare PAC/PVC  Recent labs: CMP Latest Ref Rng & Units 07/16/2019 07/12/2017 09/22/2012  Glucose 70 - 99 mg/dL 112(H) 111(H) 109(H)  BUN 6 - 20 mg/dL '12 11 10  ' Creatinine 0.44 - 1.00 mg/dL 0.89 0.91 1.00  Sodium 135 - 145 mmol/L 137 139 139   Potassium 3.5 - 5.1 mmol/L 3.8 4.2 3.8  Chloride 98 - 111 mmol/L 106 109 103  CO2 22 - 32 mmol/L 25 25 -  Calcium 8.9 - 10.3 mg/dL 8.8(L) 9.0 -  Total Protein 6.0 - 8.3 g/dL - - -  Total Bilirubin 0.3 - 1.2 mg/dL - - -  Alkaline Phos 39 - 117 U/L - - -  AST 0 - 37 U/L - - -  ALT 0 - 35 U/L - - -   CBC Latest Ref Rng & Units 07/16/2019 07/12/2017 09/22/2012  WBC 4.0 - 10.5 K/uL 4.6 5.1 -  Hemoglobin 12.0 - 15.0 g/dL 11.9(L) 12.3 13.3  Hematocrit 36.0 - 46.0 % 36.3 37.4 39.0  Platelets 150 - 400 K/uL 230 231 -   Lipid Panel  No results found for: CHOL, TRIG, HDL, CHOLHDL, VLDL, LDLCALC, LDLDIRECT HEMOGLOBIN A1C No results found for: HGBA1C, MPG TSH No results for input(s): TSH in the last 8760 hours.   01/09/2020 Glucose 97, BUN/Cr 12/1.0. EGFR 88. Na/K 140/4.5.  H/H 12.9/38.6. MCV 97.6. Platelets 241 Chol 150, TG 46, HDL 52, LDL 88 HbA1C 5.9% TSH 1.3 normal   Review of Systems  Constitutional: Negative for malaise/fatigue and weight gain.  Cardiovascular:  Positive for chest pain and palpitations. Negative for claudication, dyspnea on exertion, leg swelling, near-syncope, orthopnea, paroxysmal nocturnal dyspnea and syncope.  Respiratory:  Negative for shortness of breath.         Vitals:   05/05/21 1136  BP: 120/68  Pulse: 87  Temp: 97.7 F (36.5 C)     Body mass index is 36.61 kg/m. Filed Weights   05/05/21 1136  Weight: 220 lb (99.8 kg)     Objective:   Physical Exam Vitals and nursing note reviewed.  Constitutional:      General: She is not in acute distress. HENT:     Head: Normocephalic and atraumatic.  Neck:     Vascular: No JVD.  Cardiovascular:     Rate and Rhythm: Normal rate and regular rhythm.     Pulses: Intact distal pulses.     Heart sounds: Normal heart sounds, S1 normal and S2 normal. No murmur heard.   No gallop.  Pulmonary:     Effort: Pulmonary effort is normal. No respiratory distress.     Breath sounds: Normal breath sounds.  No wheezing, rhonchi or rales.  Musculoskeletal:     Right lower leg: No edema.     Left lower leg: No edema.  Neurological:     Mental Status: She is alert.       Assessment & Recommendations:   30 y.o. African-American female with exercise-induced asthma, former smoker, family history of early CAD, occasional palpitations  Chest pain: Calcium score 0.   Normal exercise treadmill stress test.   Normal echocardiogram Reassured patient my suspicion is low the patient her chest pain is cardiac related given unremarkable cardiovascular work-up as recently as last year. I reviewed extensive external records including ED visit as well as multiple  chest and abdomen/pelvis CT scans given the patient was concerned for atherosclerosis.  Normal cardiac or vascular abnormalities noted on imaging results reviewed.  Palpitations: Monitor in March 2021 revealed occasional PACs Suspect patient's palpitations are related to underlying PACs.  However shared decision was to repeat 1 week cardiac monitor to evaluate for PVC/PAC burden as well as other underlying cardiac arrhythmias. Could consider beta-blocker or calcium channel blocker therapy in the future if patient continues to have symptoms.  Follow-up in 8 weeks, sooner if needed, for results of cardiac monitor.   Alethia Berthold, PA-C 05/05/2021, 12:29 PM Office: [phone]

## 2021-05-07 DIAGNOSIS — F418 Other specified anxiety disorders: Secondary | ICD-10-CM | POA: Diagnosis not present

## 2021-05-07 DIAGNOSIS — Z9149 Other personal history of psychological trauma, not elsewhere classified: Secondary | ICD-10-CM | POA: Diagnosis not present

## 2021-05-19 DIAGNOSIS — K635 Polyp of colon: Secondary | ICD-10-CM | POA: Diagnosis not present

## 2021-05-19 DIAGNOSIS — Z800 Family history of malignant neoplasm of digestive organs: Secondary | ICD-10-CM | POA: Diagnosis not present

## 2021-05-19 DIAGNOSIS — K6389 Other specified diseases of intestine: Secondary | ICD-10-CM | POA: Diagnosis not present

## 2021-05-19 DIAGNOSIS — Z1211 Encounter for screening for malignant neoplasm of colon: Secondary | ICD-10-CM | POA: Diagnosis not present

## 2021-06-07 DIAGNOSIS — F418 Other specified anxiety disorders: Secondary | ICD-10-CM | POA: Diagnosis not present

## 2021-06-07 DIAGNOSIS — Z9149 Other personal history of psychological trauma, not elsewhere classified: Secondary | ICD-10-CM | POA: Diagnosis not present

## 2021-06-19 DIAGNOSIS — Z9149 Other personal history of psychological trauma, not elsewhere classified: Secondary | ICD-10-CM | POA: Diagnosis not present

## 2021-06-19 DIAGNOSIS — F418 Other specified anxiety disorders: Secondary | ICD-10-CM | POA: Diagnosis not present

## 2021-07-02 DIAGNOSIS — Z9149 Other personal history of psychological trauma, not elsewhere classified: Secondary | ICD-10-CM | POA: Diagnosis not present

## 2021-07-02 DIAGNOSIS — F418 Other specified anxiety disorders: Secondary | ICD-10-CM | POA: Diagnosis not present

## 2021-07-11 DIAGNOSIS — F418 Other specified anxiety disorders: Secondary | ICD-10-CM | POA: Diagnosis not present

## 2021-07-11 DIAGNOSIS — Z9149 Other personal history of psychological trauma, not elsewhere classified: Secondary | ICD-10-CM | POA: Diagnosis not present

## 2021-08-07 DIAGNOSIS — Z9149 Other personal history of psychological trauma, not elsewhere classified: Secondary | ICD-10-CM | POA: Diagnosis not present

## 2021-08-07 DIAGNOSIS — F418 Other specified anxiety disorders: Secondary | ICD-10-CM | POA: Diagnosis not present

## 2021-08-12 DIAGNOSIS — Z9149 Other personal history of psychological trauma, not elsewhere classified: Secondary | ICD-10-CM | POA: Diagnosis not present

## 2021-08-12 DIAGNOSIS — F418 Other specified anxiety disorders: Secondary | ICD-10-CM | POA: Diagnosis not present

## 2021-08-13 ENCOUNTER — Other Ambulatory Visit: Payer: Self-pay | Admitting: Physician Assistant

## 2021-08-13 ENCOUNTER — Ambulatory Visit
Admission: RE | Admit: 2021-08-13 | Discharge: 2021-08-13 | Disposition: A | Discharge status: Home / Self Care | Payer: BC Managed Care – PPO | Source: Ambulatory Visit | Attending: Physician Assistant | Admitting: Physician Assistant

## 2021-08-13 DIAGNOSIS — M25561 Pain in right knee: Secondary | ICD-10-CM

## 2021-08-13 DIAGNOSIS — M25551 Pain in right hip: Secondary | ICD-10-CM | POA: Diagnosis not present

## 2021-08-13 DIAGNOSIS — Z7251 High risk heterosexual behavior: Secondary | ICD-10-CM | POA: Diagnosis not present

## 2021-08-13 DIAGNOSIS — M545 Low back pain, unspecified: Secondary | ICD-10-CM

## 2021-08-14 DIAGNOSIS — M5416 Radiculopathy, lumbar region: Secondary | ICD-10-CM | POA: Diagnosis not present

## 2021-08-18 DIAGNOSIS — Z01419 Encounter for gynecological examination (general) (routine) without abnormal findings: Secondary | ICD-10-CM | POA: Diagnosis not present

## 2021-08-18 DIAGNOSIS — Z3009 Encounter for other general counseling and advice on contraception: Secondary | ICD-10-CM | POA: Diagnosis not present

## 2021-08-18 DIAGNOSIS — N898 Other specified noninflammatory disorders of vagina: Secondary | ICD-10-CM | POA: Diagnosis not present

## 2021-08-19 ENCOUNTER — Other Ambulatory Visit: Payer: Self-pay | Admitting: Internal Medicine

## 2021-08-19 DIAGNOSIS — M25551 Pain in right hip: Secondary | ICD-10-CM

## 2021-08-19 DIAGNOSIS — R2689 Other abnormalities of gait and mobility: Secondary | ICD-10-CM

## 2021-08-19 DIAGNOSIS — M25561 Pain in right knee: Secondary | ICD-10-CM

## 2021-08-25 DIAGNOSIS — M5416 Radiculopathy, lumbar region: Secondary | ICD-10-CM | POA: Diagnosis not present

## 2021-08-25 DIAGNOSIS — M25551 Pain in right hip: Secondary | ICD-10-CM | POA: Diagnosis not present

## 2021-08-27 ENCOUNTER — Other Ambulatory Visit: Payer: BC Managed Care – PPO

## 2021-08-27 ENCOUNTER — Other Ambulatory Visit: Payer: Self-pay | Admitting: Internal Medicine

## 2021-08-27 ENCOUNTER — Ambulatory Visit
Admission: RE | Admit: 2021-08-27 | Discharge: 2021-08-27 | Disposition: A | Discharge status: Home / Self Care | Payer: BC Managed Care – PPO | Source: Ambulatory Visit | Attending: Internal Medicine | Admitting: Internal Medicine

## 2021-08-27 DIAGNOSIS — M25551 Pain in right hip: Secondary | ICD-10-CM

## 2021-08-27 DIAGNOSIS — R2689 Other abnormalities of gait and mobility: Secondary | ICD-10-CM

## 2021-08-27 DIAGNOSIS — M25561 Pain in right knee: Secondary | ICD-10-CM

## 2021-08-29 DIAGNOSIS — M545 Low back pain, unspecified: Secondary | ICD-10-CM | POA: Diagnosis not present

## 2021-08-29 DIAGNOSIS — M25551 Pain in right hip: Secondary | ICD-10-CM | POA: Diagnosis not present

## 2021-08-30 DIAGNOSIS — M545 Low back pain, unspecified: Secondary | ICD-10-CM | POA: Diagnosis not present

## 2021-09-01 DIAGNOSIS — M545 Low back pain, unspecified: Secondary | ICD-10-CM | POA: Diagnosis not present

## 2021-09-03 DIAGNOSIS — F418 Other specified anxiety disorders: Secondary | ICD-10-CM | POA: Diagnosis not present

## 2021-09-03 DIAGNOSIS — M25551 Pain in right hip: Secondary | ICD-10-CM | POA: Diagnosis not present

## 2021-09-03 DIAGNOSIS — M545 Low back pain, unspecified: Secondary | ICD-10-CM | POA: Diagnosis not present

## 2021-09-03 DIAGNOSIS — Z9149 Other personal history of psychological trauma, not elsewhere classified: Secondary | ICD-10-CM | POA: Diagnosis not present

## 2021-09-06 DIAGNOSIS — Z3A01 Less than 8 weeks gestation of pregnancy: Secondary | ICD-10-CM | POA: Diagnosis not present

## 2021-09-06 DIAGNOSIS — O208 Other hemorrhage in early pregnancy: Secondary | ICD-10-CM | POA: Diagnosis not present

## 2021-09-11 DIAGNOSIS — Z630 Problems in relationship with spouse or partner: Secondary | ICD-10-CM | POA: Diagnosis not present

## 2021-09-12 DIAGNOSIS — Z3480 Encounter for supervision of other normal pregnancy, unspecified trimester: Secondary | ICD-10-CM | POA: Diagnosis not present

## 2021-09-17 DIAGNOSIS — F418 Other specified anxiety disorders: Secondary | ICD-10-CM | POA: Diagnosis not present

## 2021-09-17 DIAGNOSIS — Z9149 Other personal history of psychological trauma, not elsewhere classified: Secondary | ICD-10-CM | POA: Diagnosis not present

## 2021-09-18 DIAGNOSIS — Z630 Problems in relationship with spouse or partner: Secondary | ICD-10-CM | POA: Diagnosis not present

## 2021-10-04 DIAGNOSIS — F418 Other specified anxiety disorders: Secondary | ICD-10-CM | POA: Diagnosis not present

## 2021-10-07 DIAGNOSIS — J029 Acute pharyngitis, unspecified: Secondary | ICD-10-CM | POA: Diagnosis not present

## 2021-10-07 DIAGNOSIS — R509 Fever, unspecified: Secondary | ICD-10-CM | POA: Diagnosis not present

## 2021-10-07 DIAGNOSIS — Z20822 Contact with and (suspected) exposure to covid-19: Secondary | ICD-10-CM | POA: Diagnosis not present

## 2021-10-07 DIAGNOSIS — R519 Headache, unspecified: Secondary | ICD-10-CM | POA: Diagnosis not present

## 2021-10-07 DIAGNOSIS — B349 Viral infection, unspecified: Secondary | ICD-10-CM | POA: Diagnosis not present

## 2021-10-10 DIAGNOSIS — Z8279 Family history of other congenital malformations, deformations and chromosomal abnormalities: Secondary | ICD-10-CM | POA: Diagnosis not present

## 2021-10-10 DIAGNOSIS — Z315 Encounter for genetic counseling: Secondary | ICD-10-CM | POA: Diagnosis not present

## 2021-10-10 DIAGNOSIS — Z809 Family history of malignant neoplasm, unspecified: Secondary | ICD-10-CM | POA: Diagnosis not present

## 2021-10-10 DIAGNOSIS — Z8269 Family history of other diseases of the musculoskeletal system and connective tissue: Secondary | ICD-10-CM | POA: Diagnosis not present

## 2021-10-13 DIAGNOSIS — H66001 Acute suppurative otitis media without spontaneous rupture of ear drum, right ear: Secondary | ICD-10-CM | POA: Diagnosis not present

## 2021-10-13 DIAGNOSIS — R8290 Unspecified abnormal findings in urine: Secondary | ICD-10-CM | POA: Diagnosis not present

## 2021-10-15 DIAGNOSIS — F418 Other specified anxiety disorders: Secondary | ICD-10-CM | POA: Diagnosis not present

## 2021-10-15 DIAGNOSIS — Z9149 Other personal history of psychological trauma, not elsewhere classified: Secondary | ICD-10-CM | POA: Diagnosis not present

## 2021-10-17 DIAGNOSIS — M25551 Pain in right hip: Secondary | ICD-10-CM | POA: Diagnosis not present

## 2021-10-17 DIAGNOSIS — M545 Low back pain, unspecified: Secondary | ICD-10-CM | POA: Diagnosis not present

## 2021-10-27 DIAGNOSIS — M9903 Segmental and somatic dysfunction of lumbar region: Secondary | ICD-10-CM | POA: Diagnosis not present

## 2021-10-27 DIAGNOSIS — M9904 Segmental and somatic dysfunction of sacral region: Secondary | ICD-10-CM | POA: Diagnosis not present

## 2021-10-27 DIAGNOSIS — M9905 Segmental and somatic dysfunction of pelvic region: Secondary | ICD-10-CM | POA: Diagnosis not present

## 2021-10-27 DIAGNOSIS — M9901 Segmental and somatic dysfunction of cervical region: Secondary | ICD-10-CM | POA: Diagnosis not present

## 2021-10-29 DIAGNOSIS — O99891 Other specified diseases and conditions complicating pregnancy: Secondary | ICD-10-CM | POA: Diagnosis not present

## 2021-10-29 DIAGNOSIS — I071 Rheumatic tricuspid insufficiency: Secondary | ICD-10-CM | POA: Diagnosis not present

## 2021-10-29 DIAGNOSIS — M069 Rheumatoid arthritis, unspecified: Secondary | ICD-10-CM | POA: Diagnosis not present

## 2021-10-29 DIAGNOSIS — Z3A10 10 weeks gestation of pregnancy: Secondary | ICD-10-CM | POA: Diagnosis not present

## 2021-10-29 DIAGNOSIS — F419 Anxiety disorder, unspecified: Secondary | ICD-10-CM | POA: Diagnosis not present

## 2021-10-29 DIAGNOSIS — K219 Gastro-esophageal reflux disease without esophagitis: Secondary | ICD-10-CM | POA: Diagnosis not present

## 2021-10-29 DIAGNOSIS — O99412 Diseases of the circulatory system complicating pregnancy, second trimester: Secondary | ICD-10-CM | POA: Diagnosis not present

## 2021-10-29 DIAGNOSIS — Z3402 Encounter for supervision of normal first pregnancy, second trimester: Secondary | ICD-10-CM | POA: Diagnosis not present

## 2021-10-29 DIAGNOSIS — O99612 Diseases of the digestive system complicating pregnancy, second trimester: Secondary | ICD-10-CM | POA: Diagnosis not present

## 2021-10-29 DIAGNOSIS — Z3482 Encounter for supervision of other normal pregnancy, second trimester: Secondary | ICD-10-CM | POA: Diagnosis not present

## 2021-10-29 DIAGNOSIS — O99212 Obesity complicating pregnancy, second trimester: Secondary | ICD-10-CM | POA: Diagnosis not present

## 2021-10-29 DIAGNOSIS — F988 Other specified behavioral and emotional disorders with onset usually occurring in childhood and adolescence: Secondary | ICD-10-CM | POA: Diagnosis not present

## 2021-10-29 DIAGNOSIS — K760 Fatty (change of) liver, not elsewhere classified: Secondary | ICD-10-CM | POA: Diagnosis not present

## 2021-10-29 DIAGNOSIS — Z31430 Encounter of female for testing for genetic disease carrier status for procreative management: Secondary | ICD-10-CM | POA: Diagnosis not present

## 2021-10-29 DIAGNOSIS — Z3689 Encounter for other specified antenatal screening: Secondary | ICD-10-CM | POA: Diagnosis not present

## 2021-10-29 DIAGNOSIS — K279 Peptic ulcer, site unspecified, unspecified as acute or chronic, without hemorrhage or perforation: Secondary | ICD-10-CM | POA: Diagnosis not present

## 2021-10-29 DIAGNOSIS — O99352 Diseases of the nervous system complicating pregnancy, second trimester: Secondary | ICD-10-CM | POA: Diagnosis not present

## 2021-10-29 DIAGNOSIS — I499 Cardiac arrhythmia, unspecified: Secondary | ICD-10-CM | POA: Diagnosis not present

## 2021-10-29 DIAGNOSIS — R7303 Prediabetes: Secondary | ICD-10-CM | POA: Diagnosis not present

## 2021-10-29 DIAGNOSIS — G43909 Migraine, unspecified, not intractable, without status migrainosus: Secondary | ICD-10-CM | POA: Diagnosis not present

## 2021-10-29 DIAGNOSIS — O99342 Other mental disorders complicating pregnancy, second trimester: Secondary | ICD-10-CM | POA: Diagnosis not present

## 2021-10-31 DIAGNOSIS — M25551 Pain in right hip: Secondary | ICD-10-CM | POA: Diagnosis not present

## 2021-11-10 DIAGNOSIS — F418 Other specified anxiety disorders: Secondary | ICD-10-CM | POA: Diagnosis not present

## 2021-11-10 DIAGNOSIS — Z9149 Other personal history of psychological trauma, not elsewhere classified: Secondary | ICD-10-CM | POA: Diagnosis not present

## 2021-11-18 DIAGNOSIS — K279 Peptic ulcer, site unspecified, unspecified as acute or chronic, without hemorrhage or perforation: Secondary | ICD-10-CM | POA: Diagnosis not present

## 2021-11-18 DIAGNOSIS — G43909 Migraine, unspecified, not intractable, without status migrainosus: Secondary | ICD-10-CM | POA: Diagnosis not present

## 2021-11-18 DIAGNOSIS — K635 Polyp of colon: Secondary | ICD-10-CM | POA: Diagnosis not present

## 2021-11-18 DIAGNOSIS — K760 Fatty (change of) liver, not elsewhere classified: Secondary | ICD-10-CM | POA: Diagnosis not present

## 2021-11-18 DIAGNOSIS — O99342 Other mental disorders complicating pregnancy, second trimester: Secondary | ICD-10-CM | POA: Diagnosis not present

## 2021-11-18 DIAGNOSIS — K219 Gastro-esophageal reflux disease without esophagitis: Secondary | ICD-10-CM | POA: Diagnosis not present

## 2021-11-18 DIAGNOSIS — O219 Vomiting of pregnancy, unspecified: Secondary | ICD-10-CM | POA: Diagnosis not present

## 2021-11-18 DIAGNOSIS — O99412 Diseases of the circulatory system complicating pregnancy, second trimester: Secondary | ICD-10-CM | POA: Diagnosis not present

## 2021-11-18 DIAGNOSIS — Z3A16 16 weeks gestation of pregnancy: Secondary | ICD-10-CM | POA: Diagnosis not present

## 2021-11-18 DIAGNOSIS — O99891 Other specified diseases and conditions complicating pregnancy: Secondary | ICD-10-CM | POA: Diagnosis not present

## 2021-11-18 DIAGNOSIS — O99612 Diseases of the digestive system complicating pregnancy, second trimester: Secondary | ICD-10-CM | POA: Diagnosis not present

## 2021-11-18 DIAGNOSIS — O99352 Diseases of the nervous system complicating pregnancy, second trimester: Secondary | ICD-10-CM | POA: Diagnosis not present

## 2021-11-18 DIAGNOSIS — M069 Rheumatoid arthritis, unspecified: Secondary | ICD-10-CM | POA: Diagnosis not present

## 2021-11-18 DIAGNOSIS — R7303 Prediabetes: Secondary | ICD-10-CM | POA: Diagnosis not present

## 2021-11-18 DIAGNOSIS — I499 Cardiac arrhythmia, unspecified: Secondary | ICD-10-CM | POA: Diagnosis not present

## 2021-11-18 DIAGNOSIS — F419 Anxiety disorder, unspecified: Secondary | ICD-10-CM | POA: Diagnosis not present

## 2021-11-18 DIAGNOSIS — F909 Attention-deficit hyperactivity disorder, unspecified type: Secondary | ICD-10-CM | POA: Diagnosis not present

## 2021-11-18 DIAGNOSIS — O218 Other vomiting complicating pregnancy: Secondary | ICD-10-CM | POA: Diagnosis not present

## 2021-11-21 DIAGNOSIS — Z3A17 17 weeks gestation of pregnancy: Secondary | ICD-10-CM | POA: Diagnosis not present

## 2021-11-21 DIAGNOSIS — O358XX0 Maternal care for other (suspected) fetal abnormality and damage, not applicable or unspecified: Secondary | ICD-10-CM | POA: Diagnosis not present

## 2021-11-21 DIAGNOSIS — Z363 Encounter for antenatal screening for malformations: Secondary | ICD-10-CM | POA: Diagnosis not present

## 2021-11-21 DIAGNOSIS — O34219 Maternal care for unspecified type scar from previous cesarean delivery: Secondary | ICD-10-CM | POA: Diagnosis not present

## 2021-11-21 DIAGNOSIS — O99712 Diseases of the skin and subcutaneous tissue complicating pregnancy, second trimester: Secondary | ICD-10-CM | POA: Diagnosis not present

## 2021-11-21 DIAGNOSIS — O321XX0 Maternal care for breech presentation, not applicable or unspecified: Secondary | ICD-10-CM | POA: Diagnosis not present

## 2021-11-21 DIAGNOSIS — O99212 Obesity complicating pregnancy, second trimester: Secondary | ICD-10-CM | POA: Diagnosis not present

## 2021-11-21 DIAGNOSIS — L680 Hirsutism: Secondary | ICD-10-CM | POA: Diagnosis not present

## 2021-11-21 DIAGNOSIS — E669 Obesity, unspecified: Secondary | ICD-10-CM | POA: Diagnosis not present

## 2021-11-21 DIAGNOSIS — Z36 Encounter for antenatal screening for chromosomal anomalies: Secondary | ICD-10-CM | POA: Diagnosis not present

## 2021-11-22 DIAGNOSIS — O26892 Other specified pregnancy related conditions, second trimester: Secondary | ICD-10-CM | POA: Diagnosis not present

## 2021-11-22 DIAGNOSIS — O99891 Other specified diseases and conditions complicating pregnancy: Secondary | ICD-10-CM | POA: Diagnosis not present

## 2021-11-22 DIAGNOSIS — R1031 Right lower quadrant pain: Secondary | ICD-10-CM | POA: Diagnosis not present

## 2021-11-22 DIAGNOSIS — Z3A17 17 weeks gestation of pregnancy: Secondary | ICD-10-CM | POA: Diagnosis not present

## 2021-11-22 DIAGNOSIS — N898 Other specified noninflammatory disorders of vagina: Secondary | ICD-10-CM | POA: Diagnosis not present

## 2021-11-22 DIAGNOSIS — O34211 Maternal care for low transverse scar from previous cesarean delivery: Secondary | ICD-10-CM | POA: Diagnosis not present

## 2021-11-24 DIAGNOSIS — M9903 Segmental and somatic dysfunction of lumbar region: Secondary | ICD-10-CM | POA: Diagnosis not present

## 2021-11-24 DIAGNOSIS — M9904 Segmental and somatic dysfunction of sacral region: Secondary | ICD-10-CM | POA: Diagnosis not present

## 2021-11-24 DIAGNOSIS — M9901 Segmental and somatic dysfunction of cervical region: Secondary | ICD-10-CM | POA: Diagnosis not present

## 2021-11-24 DIAGNOSIS — M9905 Segmental and somatic dysfunction of pelvic region: Secondary | ICD-10-CM | POA: Diagnosis not present

## 2021-11-28 DIAGNOSIS — F418 Other specified anxiety disorders: Secondary | ICD-10-CM | POA: Diagnosis not present

## 2021-12-02 DIAGNOSIS — M9903 Segmental and somatic dysfunction of lumbar region: Secondary | ICD-10-CM | POA: Diagnosis not present

## 2021-12-02 DIAGNOSIS — M9901 Segmental and somatic dysfunction of cervical region: Secondary | ICD-10-CM | POA: Diagnosis not present

## 2021-12-02 DIAGNOSIS — M9905 Segmental and somatic dysfunction of pelvic region: Secondary | ICD-10-CM | POA: Diagnosis not present

## 2021-12-02 DIAGNOSIS — M9904 Segmental and somatic dysfunction of sacral region: Secondary | ICD-10-CM | POA: Diagnosis not present

## 2021-12-08 DIAGNOSIS — F418 Other specified anxiety disorders: Secondary | ICD-10-CM | POA: Diagnosis not present

## 2021-12-08 DIAGNOSIS — Z9149 Other personal history of psychological trauma, not elsewhere classified: Secondary | ICD-10-CM | POA: Diagnosis not present

## 2021-12-11 DIAGNOSIS — M9903 Segmental and somatic dysfunction of lumbar region: Secondary | ICD-10-CM | POA: Diagnosis not present

## 2021-12-11 DIAGNOSIS — M9905 Segmental and somatic dysfunction of pelvic region: Secondary | ICD-10-CM | POA: Diagnosis not present

## 2021-12-11 DIAGNOSIS — M9901 Segmental and somatic dysfunction of cervical region: Secondary | ICD-10-CM | POA: Diagnosis not present

## 2021-12-11 DIAGNOSIS — M9904 Segmental and somatic dysfunction of sacral region: Secondary | ICD-10-CM | POA: Diagnosis not present

## 2021-12-17 DIAGNOSIS — O99352 Diseases of the nervous system complicating pregnancy, second trimester: Secondary | ICD-10-CM | POA: Diagnosis not present

## 2021-12-17 DIAGNOSIS — I499 Cardiac arrhythmia, unspecified: Secondary | ICD-10-CM | POA: Diagnosis not present

## 2021-12-17 DIAGNOSIS — N23 Unspecified renal colic: Secondary | ICD-10-CM | POA: Diagnosis not present

## 2021-12-17 DIAGNOSIS — O26892 Other specified pregnancy related conditions, second trimester: Secondary | ICD-10-CM | POA: Diagnosis not present

## 2021-12-17 DIAGNOSIS — O99412 Diseases of the circulatory system complicating pregnancy, second trimester: Secondary | ICD-10-CM | POA: Diagnosis not present

## 2021-12-17 DIAGNOSIS — M549 Dorsalgia, unspecified: Secondary | ICD-10-CM | POA: Diagnosis not present

## 2021-12-17 DIAGNOSIS — R300 Dysuria: Secondary | ICD-10-CM | POA: Diagnosis not present

## 2021-12-17 DIAGNOSIS — M069 Rheumatoid arthritis, unspecified: Secondary | ICD-10-CM | POA: Diagnosis not present

## 2021-12-17 DIAGNOSIS — Z3A21 21 weeks gestation of pregnancy: Secondary | ICD-10-CM | POA: Diagnosis not present

## 2021-12-17 DIAGNOSIS — G43909 Migraine, unspecified, not intractable, without status migrainosus: Secondary | ICD-10-CM | POA: Diagnosis not present

## 2021-12-17 DIAGNOSIS — O99891 Other specified diseases and conditions complicating pregnancy: Secondary | ICD-10-CM | POA: Diagnosis not present

## 2021-12-17 DIAGNOSIS — O212 Late vomiting of pregnancy: Secondary | ICD-10-CM | POA: Diagnosis not present

## 2022-01-01 DIAGNOSIS — O26892 Other specified pregnancy related conditions, second trimester: Secondary | ICD-10-CM | POA: Diagnosis not present

## 2022-01-01 DIAGNOSIS — O4702 False labor before 37 completed weeks of gestation, second trimester: Secondary | ICD-10-CM | POA: Diagnosis not present

## 2022-01-01 DIAGNOSIS — Z3A23 23 weeks gestation of pregnancy: Secondary | ICD-10-CM | POA: Diagnosis not present

## 2022-01-01 DIAGNOSIS — R519 Headache, unspecified: Secondary | ICD-10-CM | POA: Diagnosis not present

## 2022-01-01 DIAGNOSIS — H538 Other visual disturbances: Secondary | ICD-10-CM | POA: Diagnosis not present

## 2022-01-05 DIAGNOSIS — M9905 Segmental and somatic dysfunction of pelvic region: Secondary | ICD-10-CM | POA: Diagnosis not present

## 2022-01-05 DIAGNOSIS — M9903 Segmental and somatic dysfunction of lumbar region: Secondary | ICD-10-CM | POA: Diagnosis not present

## 2022-01-05 DIAGNOSIS — M9904 Segmental and somatic dysfunction of sacral region: Secondary | ICD-10-CM | POA: Diagnosis not present

## 2022-01-05 DIAGNOSIS — M9901 Segmental and somatic dysfunction of cervical region: Secondary | ICD-10-CM | POA: Diagnosis not present

## 2022-01-15 DIAGNOSIS — Z9149 Other personal history of psychological trauma, not elsewhere classified: Secondary | ICD-10-CM | POA: Diagnosis not present

## 2022-01-15 DIAGNOSIS — F418 Other specified anxiety disorders: Secondary | ICD-10-CM | POA: Diagnosis not present

## 2022-01-18 DIAGNOSIS — R195 Other fecal abnormalities: Secondary | ICD-10-CM | POA: Diagnosis not present

## 2022-01-18 DIAGNOSIS — O99891 Other specified diseases and conditions complicating pregnancy: Secondary | ICD-10-CM | POA: Diagnosis not present

## 2022-01-18 DIAGNOSIS — R1033 Periumbilical pain: Secondary | ICD-10-CM | POA: Diagnosis not present

## 2022-01-18 DIAGNOSIS — Z3A25 25 weeks gestation of pregnancy: Secondary | ICD-10-CM | POA: Diagnosis not present

## 2022-01-18 DIAGNOSIS — O26892 Other specified pregnancy related conditions, second trimester: Secondary | ICD-10-CM | POA: Diagnosis not present

## 2022-01-18 DIAGNOSIS — M549 Dorsalgia, unspecified: Secondary | ICD-10-CM | POA: Diagnosis not present

## 2022-01-18 DIAGNOSIS — R197 Diarrhea, unspecified: Secondary | ICD-10-CM | POA: Diagnosis not present

## 2022-01-18 DIAGNOSIS — R109 Unspecified abdominal pain: Secondary | ICD-10-CM | POA: Diagnosis not present

## 2022-01-26 DIAGNOSIS — M9905 Segmental and somatic dysfunction of pelvic region: Secondary | ICD-10-CM | POA: Diagnosis not present

## 2022-01-26 DIAGNOSIS — M9901 Segmental and somatic dysfunction of cervical region: Secondary | ICD-10-CM | POA: Diagnosis not present

## 2022-01-26 DIAGNOSIS — M9904 Segmental and somatic dysfunction of sacral region: Secondary | ICD-10-CM | POA: Diagnosis not present

## 2022-01-26 DIAGNOSIS — M9903 Segmental and somatic dysfunction of lumbar region: Secondary | ICD-10-CM | POA: Diagnosis not present

## 2022-02-06 DIAGNOSIS — O34219 Maternal care for unspecified type scar from previous cesarean delivery: Secondary | ICD-10-CM | POA: Diagnosis not present

## 2022-02-06 DIAGNOSIS — E119 Type 2 diabetes mellitus without complications: Secondary | ICD-10-CM | POA: Diagnosis not present

## 2022-02-06 DIAGNOSIS — Z3A28 28 weeks gestation of pregnancy: Secondary | ICD-10-CM | POA: Diagnosis not present

## 2022-02-06 DIAGNOSIS — O24113 Pre-existing diabetes mellitus, type 2, in pregnancy, third trimester: Secondary | ICD-10-CM | POA: Diagnosis not present

## 2022-02-06 DIAGNOSIS — O35EXX0 Maternal care for other (suspected) fetal abnormality and damage, fetal genitourinary anomalies, not applicable or unspecified: Secondary | ICD-10-CM | POA: Diagnosis not present

## 2022-02-06 DIAGNOSIS — O09893 Supervision of other high risk pregnancies, third trimester: Secondary | ICD-10-CM | POA: Diagnosis not present

## 2022-02-06 DIAGNOSIS — O99213 Obesity complicating pregnancy, third trimester: Secondary | ICD-10-CM | POA: Diagnosis not present

## 2022-02-10 DIAGNOSIS — Z0184 Encounter for antibody response examination: Secondary | ICD-10-CM | POA: Diagnosis not present

## 2022-02-10 DIAGNOSIS — Z113 Encounter for screening for infections with a predominantly sexual mode of transmission: Secondary | ICD-10-CM | POA: Diagnosis not present

## 2022-02-10 DIAGNOSIS — Z131 Encounter for screening for diabetes mellitus: Secondary | ICD-10-CM | POA: Diagnosis not present

## 2022-02-10 DIAGNOSIS — Z114 Encounter for screening for human immunodeficiency virus [HIV]: Secondary | ICD-10-CM | POA: Diagnosis not present

## 2022-02-12 DIAGNOSIS — O99810 Abnormal glucose complicating pregnancy: Secondary | ICD-10-CM | POA: Diagnosis not present

## 2022-02-17 DIAGNOSIS — M9901 Segmental and somatic dysfunction of cervical region: Secondary | ICD-10-CM | POA: Diagnosis not present

## 2022-02-17 DIAGNOSIS — M9904 Segmental and somatic dysfunction of sacral region: Secondary | ICD-10-CM | POA: Diagnosis not present

## 2022-02-17 DIAGNOSIS — M9903 Segmental and somatic dysfunction of lumbar region: Secondary | ICD-10-CM | POA: Diagnosis not present

## 2022-02-17 DIAGNOSIS — M9905 Segmental and somatic dysfunction of pelvic region: Secondary | ICD-10-CM | POA: Diagnosis not present

## 2022-02-21 DIAGNOSIS — Z3A30 30 weeks gestation of pregnancy: Secondary | ICD-10-CM | POA: Diagnosis not present

## 2022-02-21 DIAGNOSIS — O99013 Anemia complicating pregnancy, third trimester: Secondary | ICD-10-CM | POA: Diagnosis not present

## 2022-02-21 DIAGNOSIS — D509 Iron deficiency anemia, unspecified: Secondary | ICD-10-CM | POA: Diagnosis not present

## 2022-02-23 DIAGNOSIS — F418 Other specified anxiety disorders: Secondary | ICD-10-CM | POA: Diagnosis not present

## 2022-02-23 DIAGNOSIS — Z9149 Other personal history of psychological trauma, not elsewhere classified: Secondary | ICD-10-CM | POA: Diagnosis not present

## 2022-03-02 ENCOUNTER — Encounter: Payer: Self-pay | Admitting: Licensed Clinical Social Worker

## 2022-03-06 ENCOUNTER — Encounter: Payer: Self-pay | Admitting: Genetic Counselor

## 2022-03-06 DIAGNOSIS — Z3689 Encounter for other specified antenatal screening: Secondary | ICD-10-CM | POA: Diagnosis not present

## 2022-03-06 DIAGNOSIS — Z3A32 32 weeks gestation of pregnancy: Secondary | ICD-10-CM | POA: Diagnosis not present

## 2022-03-06 DIAGNOSIS — O24113 Pre-existing diabetes mellitus, type 2, in pregnancy, third trimester: Secondary | ICD-10-CM | POA: Diagnosis not present

## 2022-03-06 DIAGNOSIS — O321XX0 Maternal care for breech presentation, not applicable or unspecified: Secondary | ICD-10-CM | POA: Diagnosis not present

## 2022-03-06 DIAGNOSIS — O99213 Obesity complicating pregnancy, third trimester: Secondary | ICD-10-CM | POA: Diagnosis not present

## 2022-03-06 DIAGNOSIS — O34219 Maternal care for unspecified type scar from previous cesarean delivery: Secondary | ICD-10-CM | POA: Diagnosis not present

## 2022-03-06 DIAGNOSIS — E119 Type 2 diabetes mellitus without complications: Secondary | ICD-10-CM | POA: Diagnosis not present

## 2022-03-06 DIAGNOSIS — O35EXX0 Maternal care for other (suspected) fetal abnormality and damage, fetal genitourinary anomalies, not applicable or unspecified: Secondary | ICD-10-CM | POA: Diagnosis not present

## 2022-03-06 DIAGNOSIS — L680 Hirsutism: Secondary | ICD-10-CM | POA: Diagnosis not present

## 2022-03-06 DIAGNOSIS — E669 Obesity, unspecified: Secondary | ICD-10-CM | POA: Diagnosis not present

## 2022-03-06 DIAGNOSIS — O99713 Diseases of the skin and subcutaneous tissue complicating pregnancy, third trimester: Secondary | ICD-10-CM | POA: Diagnosis not present

## 2022-03-11 DIAGNOSIS — O283 Abnormal ultrasonic finding on antenatal screening of mother: Secondary | ICD-10-CM | POA: Diagnosis not present

## 2022-03-11 DIAGNOSIS — D509 Iron deficiency anemia, unspecified: Secondary | ICD-10-CM | POA: Diagnosis not present

## 2022-03-11 DIAGNOSIS — Z3A33 33 weeks gestation of pregnancy: Secondary | ICD-10-CM | POA: Diagnosis not present

## 2022-03-11 DIAGNOSIS — O99810 Abnormal glucose complicating pregnancy: Secondary | ICD-10-CM | POA: Diagnosis not present

## 2022-03-11 DIAGNOSIS — Z23 Encounter for immunization: Secondary | ICD-10-CM | POA: Diagnosis not present

## 2022-03-11 DIAGNOSIS — O09893 Supervision of other high risk pregnancies, third trimester: Secondary | ICD-10-CM | POA: Diagnosis not present

## 2022-03-11 DIAGNOSIS — O99013 Anemia complicating pregnancy, third trimester: Secondary | ICD-10-CM | POA: Diagnosis not present

## 2022-03-18 DIAGNOSIS — J4521 Mild intermittent asthma with (acute) exacerbation: Secondary | ICD-10-CM | POA: Diagnosis not present

## 2022-03-18 DIAGNOSIS — U071 COVID-19: Secondary | ICD-10-CM | POA: Diagnosis not present

## 2022-03-19 DIAGNOSIS — N898 Other specified noninflammatory disorders of vagina: Secondary | ICD-10-CM | POA: Diagnosis not present

## 2022-03-19 DIAGNOSIS — O99891 Other specified diseases and conditions complicating pregnancy: Secondary | ICD-10-CM | POA: Diagnosis not present

## 2022-03-19 DIAGNOSIS — Z3689 Encounter for other specified antenatal screening: Secondary | ICD-10-CM | POA: Diagnosis not present

## 2022-03-19 DIAGNOSIS — O26893 Other specified pregnancy related conditions, third trimester: Secondary | ICD-10-CM | POA: Diagnosis not present

## 2022-03-19 DIAGNOSIS — R519 Headache, unspecified: Secondary | ICD-10-CM | POA: Diagnosis not present

## 2022-03-19 DIAGNOSIS — U071 COVID-19: Secondary | ICD-10-CM | POA: Diagnosis not present

## 2022-03-19 DIAGNOSIS — B9689 Other specified bacterial agents as the cause of diseases classified elsewhere: Secondary | ICD-10-CM | POA: Diagnosis not present

## 2022-03-19 DIAGNOSIS — R059 Cough, unspecified: Secondary | ICD-10-CM | POA: Diagnosis not present

## 2022-03-19 DIAGNOSIS — R509 Fever, unspecified: Secondary | ICD-10-CM | POA: Diagnosis not present

## 2022-03-19 DIAGNOSIS — Z3A34 34 weeks gestation of pregnancy: Secondary | ICD-10-CM | POA: Diagnosis not present

## 2022-03-19 DIAGNOSIS — O23593 Infection of other part of genital tract in pregnancy, third trimester: Secondary | ICD-10-CM | POA: Diagnosis not present

## 2022-03-19 DIAGNOSIS — O98513 Other viral diseases complicating pregnancy, third trimester: Secondary | ICD-10-CM | POA: Diagnosis not present

## 2022-03-20 DIAGNOSIS — O26893 Other specified pregnancy related conditions, third trimester: Secondary | ICD-10-CM | POA: Diagnosis not present

## 2022-03-20 DIAGNOSIS — Z3689 Encounter for other specified antenatal screening: Secondary | ICD-10-CM | POA: Diagnosis not present

## 2022-03-20 DIAGNOSIS — R059 Cough, unspecified: Secondary | ICD-10-CM | POA: Diagnosis not present

## 2022-03-20 DIAGNOSIS — R509 Fever, unspecified: Secondary | ICD-10-CM | POA: Diagnosis not present

## 2022-03-20 DIAGNOSIS — O98513 Other viral diseases complicating pregnancy, third trimester: Secondary | ICD-10-CM | POA: Diagnosis not present

## 2022-03-20 DIAGNOSIS — N898 Other specified noninflammatory disorders of vagina: Secondary | ICD-10-CM | POA: Diagnosis not present

## 2022-03-20 DIAGNOSIS — U071 COVID-19: Secondary | ICD-10-CM | POA: Diagnosis not present

## 2022-03-20 DIAGNOSIS — R519 Headache, unspecified: Secondary | ICD-10-CM | POA: Diagnosis not present

## 2022-03-20 DIAGNOSIS — O99891 Other specified diseases and conditions complicating pregnancy: Secondary | ICD-10-CM | POA: Diagnosis not present

## 2022-03-20 DIAGNOSIS — Z3A34 34 weeks gestation of pregnancy: Secondary | ICD-10-CM | POA: Diagnosis not present

## 2022-03-31 DIAGNOSIS — Z3A35 35 weeks gestation of pregnancy: Secondary | ICD-10-CM | POA: Diagnosis not present

## 2022-03-31 DIAGNOSIS — D508 Other iron deficiency anemias: Secondary | ICD-10-CM | POA: Diagnosis not present

## 2022-04-03 DIAGNOSIS — O99210 Obesity complicating pregnancy, unspecified trimester: Secondary | ICD-10-CM | POA: Diagnosis not present

## 2022-04-03 DIAGNOSIS — O34219 Maternal care for unspecified type scar from previous cesarean delivery: Secondary | ICD-10-CM | POA: Diagnosis not present

## 2022-04-03 DIAGNOSIS — R300 Dysuria: Secondary | ICD-10-CM | POA: Diagnosis not present

## 2022-04-03 DIAGNOSIS — O99213 Obesity complicating pregnancy, third trimester: Secondary | ICD-10-CM | POA: Diagnosis not present

## 2022-04-03 DIAGNOSIS — Z3689 Encounter for other specified antenatal screening: Secondary | ICD-10-CM | POA: Diagnosis not present

## 2022-04-03 DIAGNOSIS — E669 Obesity, unspecified: Secondary | ICD-10-CM | POA: Diagnosis not present

## 2022-04-03 DIAGNOSIS — Z3A36 36 weeks gestation of pregnancy: Secondary | ICD-10-CM | POA: Diagnosis not present

## 2022-04-03 DIAGNOSIS — O35EXX0 Maternal care for other (suspected) fetal abnormality and damage, fetal genitourinary anomalies, not applicable or unspecified: Secondary | ICD-10-CM | POA: Diagnosis not present

## 2022-04-06 DIAGNOSIS — Z9149 Other personal history of psychological trauma, not elsewhere classified: Secondary | ICD-10-CM | POA: Diagnosis not present

## 2022-04-06 DIAGNOSIS — F418 Other specified anxiety disorders: Secondary | ICD-10-CM | POA: Diagnosis not present

## 2022-04-08 DIAGNOSIS — Z3689 Encounter for other specified antenatal screening: Secondary | ICD-10-CM | POA: Diagnosis not present

## 2022-04-08 DIAGNOSIS — O35EXX0 Maternal care for other (suspected) fetal abnormality and damage, fetal genitourinary anomalies, not applicable or unspecified: Secondary | ICD-10-CM | POA: Diagnosis not present

## 2022-04-08 DIAGNOSIS — O99013 Anemia complicating pregnancy, third trimester: Secondary | ICD-10-CM | POA: Diagnosis not present

## 2022-04-08 DIAGNOSIS — O34219 Maternal care for unspecified type scar from previous cesarean delivery: Secondary | ICD-10-CM | POA: Diagnosis not present

## 2022-04-08 DIAGNOSIS — D509 Iron deficiency anemia, unspecified: Secondary | ICD-10-CM | POA: Diagnosis not present

## 2022-04-08 DIAGNOSIS — Z3A37 37 weeks gestation of pregnancy: Secondary | ICD-10-CM | POA: Diagnosis not present

## 2022-04-08 DIAGNOSIS — Z3685 Encounter for antenatal screening for Streptococcus B: Secondary | ICD-10-CM | POA: Diagnosis not present

## 2022-04-09 DIAGNOSIS — Q620 Congenital hydronephrosis: Secondary | ICD-10-CM | POA: Diagnosis not present

## 2022-04-10 DIAGNOSIS — O99210 Obesity complicating pregnancy, unspecified trimester: Secondary | ICD-10-CM | POA: Diagnosis not present

## 2022-04-10 DIAGNOSIS — O34219 Maternal care for unspecified type scar from previous cesarean delivery: Secondary | ICD-10-CM | POA: Diagnosis not present

## 2022-04-10 DIAGNOSIS — Z3A37 37 weeks gestation of pregnancy: Secondary | ICD-10-CM | POA: Diagnosis not present

## 2022-04-10 DIAGNOSIS — O35EXX0 Maternal care for other (suspected) fetal abnormality and damage, fetal genitourinary anomalies, not applicable or unspecified: Secondary | ICD-10-CM | POA: Diagnosis not present

## 2022-04-10 DIAGNOSIS — O99213 Obesity complicating pregnancy, third trimester: Secondary | ICD-10-CM | POA: Diagnosis not present

## 2022-04-24 DIAGNOSIS — O35EXX0 Maternal care for other (suspected) fetal abnormality and damage, fetal genitourinary anomalies, not applicable or unspecified: Secondary | ICD-10-CM | POA: Diagnosis not present

## 2022-04-24 DIAGNOSIS — Z3689 Encounter for other specified antenatal screening: Secondary | ICD-10-CM | POA: Diagnosis not present

## 2022-04-24 DIAGNOSIS — O99213 Obesity complicating pregnancy, third trimester: Secondary | ICD-10-CM | POA: Diagnosis not present

## 2022-04-24 DIAGNOSIS — O99210 Obesity complicating pregnancy, unspecified trimester: Secondary | ICD-10-CM | POA: Diagnosis not present

## 2022-04-24 DIAGNOSIS — E669 Obesity, unspecified: Secondary | ICD-10-CM | POA: Diagnosis not present

## 2022-04-24 DIAGNOSIS — Z3A39 39 weeks gestation of pregnancy: Secondary | ICD-10-CM | POA: Diagnosis not present

## 2022-04-28 DIAGNOSIS — O99013 Anemia complicating pregnancy, third trimester: Secondary | ICD-10-CM | POA: Diagnosis not present

## 2022-04-28 DIAGNOSIS — O09293 Supervision of pregnancy with other poor reproductive or obstetric history, third trimester: Secondary | ICD-10-CM | POA: Diagnosis not present

## 2022-04-28 DIAGNOSIS — O99810 Abnormal glucose complicating pregnancy: Secondary | ICD-10-CM | POA: Diagnosis not present

## 2022-04-28 DIAGNOSIS — Z3A39 39 weeks gestation of pregnancy: Secondary | ICD-10-CM | POA: Diagnosis not present

## 2022-04-28 DIAGNOSIS — O471 False labor at or after 37 completed weeks of gestation: Secondary | ICD-10-CM | POA: Diagnosis not present

## 2022-04-28 DIAGNOSIS — O34211 Maternal care for low transverse scar from previous cesarean delivery: Secondary | ICD-10-CM | POA: Diagnosis not present

## 2022-04-28 DIAGNOSIS — D509 Iron deficiency anemia, unspecified: Secondary | ICD-10-CM | POA: Diagnosis not present

## 2022-05-01 DIAGNOSIS — O99213 Obesity complicating pregnancy, third trimester: Secondary | ICD-10-CM | POA: Diagnosis not present

## 2022-05-01 DIAGNOSIS — Z3A40 40 weeks gestation of pregnancy: Secondary | ICD-10-CM | POA: Diagnosis not present

## 2022-05-01 DIAGNOSIS — O35EXX0 Maternal care for other (suspected) fetal abnormality and damage, fetal genitourinary anomalies, not applicable or unspecified: Secondary | ICD-10-CM | POA: Diagnosis not present

## 2022-05-01 DIAGNOSIS — O34219 Maternal care for unspecified type scar from previous cesarean delivery: Secondary | ICD-10-CM | POA: Diagnosis not present

## 2022-05-01 DIAGNOSIS — E669 Obesity, unspecified: Secondary | ICD-10-CM | POA: Diagnosis not present

## 2022-05-01 DIAGNOSIS — O09893 Supervision of other high risk pregnancies, third trimester: Secondary | ICD-10-CM | POA: Diagnosis not present

## 2022-05-01 DIAGNOSIS — Z3689 Encounter for other specified antenatal screening: Secondary | ICD-10-CM | POA: Diagnosis not present

## 2022-05-04 DIAGNOSIS — O34211 Maternal care for low transverse scar from previous cesarean delivery: Secondary | ICD-10-CM | POA: Diagnosis not present

## 2022-05-04 DIAGNOSIS — D509 Iron deficiency anemia, unspecified: Secondary | ICD-10-CM | POA: Diagnosis not present

## 2022-05-04 DIAGNOSIS — O99810 Abnormal glucose complicating pregnancy: Secondary | ICD-10-CM | POA: Diagnosis not present

## 2022-05-04 DIAGNOSIS — O99013 Anemia complicating pregnancy, third trimester: Secondary | ICD-10-CM | POA: Diagnosis not present

## 2022-05-05 DIAGNOSIS — O135 Gestational [pregnancy-induced] hypertension without significant proteinuria, complicating the puerperium: Secondary | ICD-10-CM | POA: Diagnosis not present

## 2022-05-05 DIAGNOSIS — Z86711 Personal history of pulmonary embolism: Secondary | ICD-10-CM | POA: Diagnosis not present

## 2022-05-05 DIAGNOSIS — O358XX0 Maternal care for other (suspected) fetal abnormality and damage, not applicable or unspecified: Secondary | ICD-10-CM | POA: Diagnosis not present

## 2022-05-05 DIAGNOSIS — R079 Chest pain, unspecified: Secondary | ICD-10-CM | POA: Diagnosis not present

## 2022-05-05 DIAGNOSIS — D509 Iron deficiency anemia, unspecified: Secondary | ICD-10-CM | POA: Diagnosis not present

## 2022-05-05 DIAGNOSIS — O99891 Other specified diseases and conditions complicating pregnancy: Secondary | ICD-10-CM | POA: Diagnosis not present

## 2022-05-05 DIAGNOSIS — Z3A41 41 weeks gestation of pregnancy: Secondary | ICD-10-CM | POA: Diagnosis not present

## 2022-05-05 DIAGNOSIS — O99814 Abnormal glucose complicating childbirth: Secondary | ICD-10-CM | POA: Diagnosis not present

## 2022-05-05 DIAGNOSIS — H43399 Other vitreous opacities, unspecified eye: Secondary | ICD-10-CM | POA: Diagnosis not present

## 2022-05-05 DIAGNOSIS — M069 Rheumatoid arthritis, unspecified: Secondary | ICD-10-CM | POA: Diagnosis not present

## 2022-05-05 DIAGNOSIS — O9089 Other complications of the puerperium, not elsewhere classified: Secondary | ICD-10-CM | POA: Diagnosis not present

## 2022-05-05 DIAGNOSIS — O1415 Severe pre-eclampsia, complicating the puerperium: Secondary | ICD-10-CM | POA: Diagnosis not present

## 2022-05-05 DIAGNOSIS — Z3A40 40 weeks gestation of pregnancy: Secondary | ICD-10-CM | POA: Diagnosis not present

## 2022-05-05 DIAGNOSIS — O9962 Diseases of the digestive system complicating childbirth: Secondary | ICD-10-CM | POA: Diagnosis not present

## 2022-05-05 DIAGNOSIS — R000 Tachycardia, unspecified: Secondary | ICD-10-CM | POA: Diagnosis not present

## 2022-05-05 DIAGNOSIS — R519 Headache, unspecified: Secondary | ICD-10-CM | POA: Diagnosis not present

## 2022-05-05 DIAGNOSIS — Z86718 Personal history of other venous thrombosis and embolism: Secondary | ICD-10-CM | POA: Diagnosis not present

## 2022-05-05 DIAGNOSIS — O99013 Anemia complicating pregnancy, third trimester: Secondary | ICD-10-CM | POA: Diagnosis not present

## 2022-05-05 DIAGNOSIS — Z9889 Other specified postprocedural states: Secondary | ICD-10-CM | POA: Diagnosis not present

## 2022-05-05 DIAGNOSIS — R071 Chest pain on breathing: Secondary | ICD-10-CM | POA: Diagnosis not present

## 2022-05-05 DIAGNOSIS — M79604 Pain in right leg: Secondary | ICD-10-CM | POA: Diagnosis not present

## 2022-05-05 DIAGNOSIS — K219 Gastro-esophageal reflux disease without esophagitis: Secondary | ICD-10-CM | POA: Diagnosis not present

## 2022-05-05 DIAGNOSIS — O1414 Severe pre-eclampsia complicating childbirth: Secondary | ICD-10-CM | POA: Diagnosis not present

## 2022-05-05 DIAGNOSIS — O35EXX0 Maternal care for other (suspected) fetal abnormality and damage, fetal genitourinary anomalies, not applicable or unspecified: Secondary | ICD-10-CM | POA: Diagnosis not present

## 2022-05-05 DIAGNOSIS — O480 Post-term pregnancy: Secondary | ICD-10-CM | POA: Diagnosis not present

## 2022-05-05 DIAGNOSIS — O34211 Maternal care for low transverse scar from previous cesarean delivery: Secondary | ICD-10-CM | POA: Diagnosis not present

## 2022-05-13 DIAGNOSIS — O1415 Severe pre-eclampsia, complicating the puerperium: Secondary | ICD-10-CM | POA: Diagnosis not present

## 2022-05-13 DIAGNOSIS — R519 Headache, unspecified: Secondary | ICD-10-CM | POA: Diagnosis not present

## 2022-05-13 DIAGNOSIS — O9089 Other complications of the puerperium, not elsewhere classified: Secondary | ICD-10-CM | POA: Diagnosis not present

## 2022-05-16 DIAGNOSIS — H5789 Other specified disorders of eye and adnexa: Secondary | ICD-10-CM | POA: Diagnosis not present

## 2022-05-16 DIAGNOSIS — R531 Weakness: Secondary | ICD-10-CM | POA: Diagnosis not present

## 2022-05-16 DIAGNOSIS — M542 Cervicalgia: Secondary | ICD-10-CM | POA: Diagnosis not present

## 2022-05-16 DIAGNOSIS — G8191 Hemiplegia, unspecified affecting right dominant side: Secondary | ICD-10-CM | POA: Diagnosis not present

## 2022-05-16 DIAGNOSIS — M545 Low back pain, unspecified: Secondary | ICD-10-CM | POA: Diagnosis not present

## 2022-05-16 DIAGNOSIS — R519 Headache, unspecified: Secondary | ICD-10-CM | POA: Diagnosis not present

## 2022-05-16 DIAGNOSIS — O9089 Other complications of the puerperium, not elsewhere classified: Secondary | ICD-10-CM | POA: Diagnosis not present

## 2022-05-16 DIAGNOSIS — Z98891 History of uterine scar from previous surgery: Secondary | ICD-10-CM | POA: Diagnosis not present

## 2022-06-17 DIAGNOSIS — F53 Postpartum depression: Secondary | ICD-10-CM | POA: Diagnosis not present

## 2022-06-17 DIAGNOSIS — O99345 Other mental disorders complicating the puerperium: Secondary | ICD-10-CM | POA: Diagnosis not present

## 2022-06-17 DIAGNOSIS — R519 Headache, unspecified: Secondary | ICD-10-CM | POA: Diagnosis not present

## 2022-06-17 DIAGNOSIS — Z79899 Other long term (current) drug therapy: Secondary | ICD-10-CM | POA: Diagnosis not present

## 2022-06-17 DIAGNOSIS — O9089 Other complications of the puerperium, not elsewhere classified: Secondary | ICD-10-CM | POA: Diagnosis not present

## 2022-06-17 DIAGNOSIS — Z23 Encounter for immunization: Secondary | ICD-10-CM | POA: Diagnosis not present

## 2022-06-17 DIAGNOSIS — O1415 Severe pre-eclampsia, complicating the puerperium: Secondary | ICD-10-CM | POA: Diagnosis not present

## 2022-07-06 DIAGNOSIS — Z882 Allergy status to sulfonamides status: Secondary | ICD-10-CM | POA: Diagnosis not present

## 2022-07-06 DIAGNOSIS — Z881 Allergy status to other antibiotic agents status: Secondary | ICD-10-CM | POA: Diagnosis not present

## 2022-07-06 DIAGNOSIS — Z30430 Encounter for insertion of intrauterine contraceptive device: Secondary | ICD-10-CM | POA: Diagnosis not present

## 2022-07-06 DIAGNOSIS — Z91040 Latex allergy status: Secondary | ICD-10-CM | POA: Diagnosis not present

## 2022-07-09 DIAGNOSIS — M9901 Segmental and somatic dysfunction of cervical region: Secondary | ICD-10-CM | POA: Diagnosis not present

## 2022-07-09 DIAGNOSIS — M9905 Segmental and somatic dysfunction of pelvic region: Secondary | ICD-10-CM | POA: Diagnosis not present

## 2022-07-09 DIAGNOSIS — M9903 Segmental and somatic dysfunction of lumbar region: Secondary | ICD-10-CM | POA: Diagnosis not present

## 2022-07-09 DIAGNOSIS — M9902 Segmental and somatic dysfunction of thoracic region: Secondary | ICD-10-CM | POA: Diagnosis not present

## 2022-07-13 DIAGNOSIS — J069 Acute upper respiratory infection, unspecified: Secondary | ICD-10-CM | POA: Diagnosis not present

## 2022-07-13 DIAGNOSIS — R051 Acute cough: Secondary | ICD-10-CM | POA: Diagnosis not present

## 2022-07-13 DIAGNOSIS — R0981 Nasal congestion: Secondary | ICD-10-CM | POA: Diagnosis not present

## 2022-07-13 DIAGNOSIS — J4521 Mild intermittent asthma with (acute) exacerbation: Secondary | ICD-10-CM | POA: Diagnosis not present

## 2022-08-11 DIAGNOSIS — Z0000 Encounter for general adult medical examination without abnormal findings: Secondary | ICD-10-CM | POA: Diagnosis not present

## 2022-08-11 DIAGNOSIS — Z87891 Personal history of nicotine dependence: Secondary | ICD-10-CM | POA: Diagnosis not present

## 2022-08-11 DIAGNOSIS — Z13220 Encounter for screening for lipoid disorders: Secondary | ICD-10-CM | POA: Diagnosis not present

## 2022-08-11 DIAGNOSIS — E559 Vitamin D deficiency, unspecified: Secondary | ICD-10-CM | POA: Diagnosis not present

## 2022-08-11 DIAGNOSIS — Z6841 Body Mass Index (BMI) 40.0 and over, adult: Secondary | ICD-10-CM | POA: Diagnosis not present

## 2022-08-18 DIAGNOSIS — R21 Rash and other nonspecific skin eruption: Secondary | ICD-10-CM | POA: Diagnosis not present

## 2022-08-18 DIAGNOSIS — Z30432 Encounter for removal of intrauterine contraceptive device: Secondary | ICD-10-CM | POA: Diagnosis not present

## 2022-08-31 DIAGNOSIS — E039 Hypothyroidism, unspecified: Secondary | ICD-10-CM | POA: Diagnosis not present

## 2022-08-31 DIAGNOSIS — H04123 Dry eye syndrome of bilateral lacrimal glands: Secondary | ICD-10-CM | POA: Diagnosis not present

## 2022-08-31 DIAGNOSIS — K5900 Constipation, unspecified: Secondary | ICD-10-CM | POA: Diagnosis not present

## 2022-08-31 DIAGNOSIS — Z3009 Encounter for other general counseling and advice on contraception: Secondary | ICD-10-CM | POA: Diagnosis not present

## 2022-08-31 DIAGNOSIS — R5383 Other fatigue: Secondary | ICD-10-CM | POA: Diagnosis not present

## 2022-08-31 DIAGNOSIS — R002 Palpitations: Secondary | ICD-10-CM | POA: Diagnosis not present

## 2022-08-31 DIAGNOSIS — R7303 Prediabetes: Secondary | ICD-10-CM | POA: Diagnosis not present

## 2022-08-31 DIAGNOSIS — R21 Rash and other nonspecific skin eruption: Secondary | ICD-10-CM | POA: Diagnosis not present

## 2022-08-31 DIAGNOSIS — R519 Headache, unspecified: Secondary | ICD-10-CM | POA: Diagnosis not present

## 2022-08-31 DIAGNOSIS — E538 Deficiency of other specified B group vitamins: Secondary | ICD-10-CM | POA: Diagnosis not present

## 2022-08-31 DIAGNOSIS — E559 Vitamin D deficiency, unspecified: Secondary | ICD-10-CM | POA: Diagnosis not present

## 2022-09-15 DIAGNOSIS — R002 Palpitations: Secondary | ICD-10-CM | POA: Diagnosis not present

## 2022-09-15 DIAGNOSIS — R519 Headache, unspecified: Secondary | ICD-10-CM | POA: Diagnosis not present

## 2022-09-15 DIAGNOSIS — R42 Dizziness and giddiness: Secondary | ICD-10-CM | POA: Diagnosis not present

## 2022-09-16 DIAGNOSIS — R5383 Other fatigue: Secondary | ICD-10-CM | POA: Diagnosis not present

## 2022-09-16 DIAGNOSIS — R0789 Other chest pain: Secondary | ICD-10-CM | POA: Diagnosis not present

## 2022-09-16 DIAGNOSIS — R002 Palpitations: Secondary | ICD-10-CM | POA: Diagnosis not present

## 2022-09-16 DIAGNOSIS — R519 Headache, unspecified: Secondary | ICD-10-CM | POA: Diagnosis not present

## 2022-09-16 DIAGNOSIS — J45909 Unspecified asthma, uncomplicated: Secondary | ICD-10-CM | POA: Diagnosis not present

## 2022-09-17 ENCOUNTER — Ambulatory Visit: Payer: BC Managed Care – PPO | Admitting: Internal Medicine

## 2022-09-17 VITALS — BP 120/70 | HR 86 | Ht 65.0 in | Wt 246.0 lb

## 2022-09-17 DIAGNOSIS — R002 Palpitations: Secondary | ICD-10-CM

## 2022-09-17 DIAGNOSIS — R072 Precordial pain: Secondary | ICD-10-CM

## 2022-09-17 DIAGNOSIS — R0602 Shortness of breath: Secondary | ICD-10-CM | POA: Diagnosis not present

## 2022-09-17 MED ORDER — PROPRANOLOL HCL 10 MG PO TABS: 10.0000 mg | ORAL_TABLET | Freq: Three times a day (TID) | ORAL | 6 refills | Status: AC | PRN

## 2022-09-17 NOTE — Progress Notes (Signed)
Patient referred by [name], MD for irregular heartbeat  Subjective:   Amanda Patrick, female    DOB: [birth year], 32 y.o.   MRN: [id number]   Chief Complaint  Patient presents with   Chest Pain   Shortness of Breath     Chest Pain  Associated symptoms include palpitations and shortness of breath. Pertinent negatives include no claudication, malaise/fatigue, near-syncope, orthopnea, PND or syncope.  Shortness of Breath Associated symptoms include chest pain. Pertinent negatives include no claudication, leg swelling, orthopnea, PND or syncope.    32 y.o. African-American female with exercise-induced asthma, former smoker, family history of early CAD.   Patient has had multiple ED visits and hospital admission for noncardiac issues including cholecystectomy and emergency D&C.  She was evaluated in an outside emergency department 01/09/2021 with chest pain at which time troponins were negative, EKG without ischemic changes, BNP normal, overall unremarkable work-up. Patient was last seen in our office 05/05/2021 at which time she was advised for as needed follow-up given unremarkable cardiac work-up.   Patient now presents with concerns of chest pain and increased frequency of palpitations over the last few weeks. She just had her baby about 4 months ago and she was diagnosed and treated for postpartum pre-eclampsia. She was on labetalol for a few weeks and now her blood pressure is back to normal without medications. She is experiencing the same palpitations she had a few years ago. She has not had a sleep study but both her PCP and myself have recommended one. She tries to eat healthy and exercise to lose weight but no matter what she does she is unable to lose weight. She is pre-diabetic and willing to try Ozempic. Patient denies claudication, light-headedness, diaphoresis, syncope, edema, PND, orthopnea.    Current Outpatient Medications on File Prior to Visit   Medication Sig Dispense Refill   albuterol (PROVENTIL HFA;VENTOLIN HFA) 108 (90 BASE) MCG/ACT inhaler Inhale 2 puffs into the lungs every 6 (six) hours as needed for wheezing or shortness of breath.      Azelastine-Fluticasone 137-50 MCG/ACT SUSP Place into the nose.     cholecalciferol (VITAMIN D3) 25 MCG (1000 UNIT) tablet Take 1,000 Units by mouth daily.     cyanocobalamin (VITAMIN B12) 500 MCG tablet Take 500 mcg by mouth daily.     EPINEPHrine 0.3 mg/0.3 mL IJ SOAJ injection Inject 0.3 mg into the muscle once.     Ferrous Sulfate (IRON PO) Take 1 tablet by mouth daily.     ferrous sulfate 325 (65 FE) MG tablet Take 325 mg by mouth daily with breakfast.     levocetirizine (XYZAL) 5 MG tablet Take 5 mg by mouth every evening.     Omega 3 1200 MG CAPS Take by mouth.     amphetamine-dextroamphetamine (ADDERALL) 20 MG tablet Take 20 mg by mouth daily. (Patient not taking: Reported on 09/17/2022)     [DISCONTINUED] etonogestrel (NEXPLANON) 68 MG IMPL implant 1 each by Subdermal route once.     No current facility-administered medications on file prior to visit.    Cardiovascular and other pertinent studies: EKG 05/05/2021:  Normal sinus rhythm at a rate of 81 bpm.  Normal axis.  No evidence of ischemia or underlying injury pattern.  Exercise treadmill stress test 04/08/2020: Exercise treadmill stress test performed using Bruce protocol.  Patient reached 10.9 METS, and 102% of age predicted maximum heart rate.  Exercise capacity was excellent.  No chest pain reported.  Normal heart rate and  hemodynamic response. Stress EKG revealed no ischemic changes. Low risk study.  CT cardiac scoring 01/2020: Calcium score: 0 Potential 4 mm nodule in RML  EKG 01/16/2020: Sinus rhythm 63 bpm Normal EKG  Echocardiogram 09/29/2019: EF 55-60% Mild TR  Zio patch 09/2019: Rare PAC/PVC  Recent labs:    Latest Ref Rng & Units 07/16/2019   11:14 PM 07/12/2017    1:30 PM 09/22/2012    7:35 PM  CMP   Glucose 70 - 99 mg/dL 112  111  109   BUN 6 - 20 mg/dL '12  11  10   '$ Creatinine 0.44 - 1.00 mg/dL 0.89  0.91  1.00   Sodium 135 - 145 mmol/L 137  139  139   Potassium 3.5 - 5.1 mmol/L 3.8  4.2  3.8   Chloride 98 - 111 mmol/L 106  109  103   CO2 22 - 32 mmol/L 25  25    Calcium 8.9 - 10.3 mg/dL 8.8  9.0        Latest Ref Rng & Units 07/16/2019   11:14 PM 07/12/2017    1:30 PM 09/22/2012    7:35 PM  CBC  WBC 4.0 - 10.5 K/uL 4.6  5.1    Hemoglobin 12.0 - 15.0 g/dL 11.9  12.3  13.3   Hematocrit 36.0 - 46.0 % 36.3  37.4  39.0   Platelets 150 - 400 K/uL 230  231     Lipid Panel  No results found for: "CHOL", "TRIG", "HDL", "CHOLHDL", "VLDL", "LDLCALC", "LDLDIRECT" HEMOGLOBIN A1C No results found for: "HGBA1C", "MPG" TSH No results for input(s): "TSH" in the last 8760 hours.   01/09/2020 Glucose 97, BUN/Cr 12/1.0. EGFR 88. Na/K 140/4.5.  H/H 12.9/38.6. MCV 97.6. Platelets 241 Chol 150, TG 46, HDL 52, LDL 88 HbA1C 5.9% TSH 1.3 normal   Review of Systems  Constitutional: Negative for malaise/fatigue and weight gain.  Cardiovascular:  Positive for chest pain and palpitations. Negative for claudication, dyspnea on exertion, leg swelling, near-syncope, orthopnea, paroxysmal nocturnal dyspnea and syncope.  Respiratory:  Positive for shortness of breath.          Vitals:   09/17/22 1145  BP: 120/70  Pulse: 86  SpO2: 98%     Body mass index is 40.94 kg/m. Filed Weights   09/17/22 1145  Weight: 246 lb (111.6 kg)     Objective:   Physical Exam Vitals and nursing note reviewed.  Constitutional:      General: She is not in acute distress. HENT:     Head: Normocephalic and atraumatic.  Neck:     Vascular: No JVD.  Cardiovascular:     Rate and Rhythm: Normal rate and regular rhythm.     Pulses: Intact distal pulses.     Heart sounds: Normal heart sounds, S1 normal and S2 normal. No murmur heard.    No gallop.  Pulmonary:     Effort: Pulmonary effort is normal. No  respiratory distress.     Breath sounds: Normal breath sounds. No wheezing, rhonchi or rales.  Musculoskeletal:     Right lower leg: No edema.     Left lower leg: No edema.  Neurological:     Mental Status: She is alert.        Assessment & Recommendations:   32 y.o. African-American female with exercise-induced asthma, former smoker, family history of early CAD, occasional palpitations  Precordial pain: Calcium score 0.   Normal exercise treadmill stress test.   Normal echocardiogram Reassured patient  my suspicion is low the patient her chest pain is cardiac related given unremarkable cardiovascular work-up in the past. Normal cardiac or vascular abnormalities noted on imaging results reviewed.   Palpitations: Monitor in March 2021 revealed occasional PACs Suspect patient's palpitations are related to underlying PACs.  Propranolol 10 mg TID PRN for palpitations.   SOB (shortness of breath) Echocardiogram in 2021 was normal Will refer to sleep medicine as she likely has sleep apnea Will initiate Ozempic as she is having trouble losing weight and she is pre-diabetic   Follow-up in 1-2 months or sooner if needed    Floydene Flock, DO, [facility] 09/17/2022, 12:35 PM Office: [phone]

## 2022-10-01 DIAGNOSIS — G4719 Other hypersomnia: Secondary | ICD-10-CM | POA: Diagnosis not present

## 2022-10-05 DIAGNOSIS — M9902 Segmental and somatic dysfunction of thoracic region: Secondary | ICD-10-CM | POA: Diagnosis not present

## 2022-10-05 DIAGNOSIS — M9905 Segmental and somatic dysfunction of pelvic region: Secondary | ICD-10-CM | POA: Diagnosis not present

## 2022-10-05 DIAGNOSIS — M9901 Segmental and somatic dysfunction of cervical region: Secondary | ICD-10-CM | POA: Diagnosis not present

## 2022-10-05 DIAGNOSIS — M9903 Segmental and somatic dysfunction of lumbar region: Secondary | ICD-10-CM | POA: Diagnosis not present

## 2022-10-12 ENCOUNTER — Ambulatory Visit
Admission: RE | Admit: 2022-10-12 | Discharge: 2022-10-12 | Disposition: A | Discharge status: Home / Self Care | Payer: BC Managed Care – PPO | Source: Ambulatory Visit | Attending: Internal Medicine | Admitting: Internal Medicine

## 2022-10-12 ENCOUNTER — Other Ambulatory Visit: Payer: Self-pay | Admitting: Internal Medicine

## 2022-10-12 DIAGNOSIS — M549 Dorsalgia, unspecified: Secondary | ICD-10-CM | POA: Diagnosis not present

## 2022-10-12 DIAGNOSIS — M542 Cervicalgia: Secondary | ICD-10-CM

## 2022-10-12 DIAGNOSIS — J309 Allergic rhinitis, unspecified: Secondary | ICD-10-CM | POA: Diagnosis not present

## 2022-10-12 DIAGNOSIS — R519 Headache, unspecified: Secondary | ICD-10-CM | POA: Diagnosis not present

## 2022-10-14 DIAGNOSIS — K5900 Constipation, unspecified: Secondary | ICD-10-CM | POA: Diagnosis not present

## 2022-10-14 DIAGNOSIS — R7303 Prediabetes: Secondary | ICD-10-CM | POA: Diagnosis not present

## 2022-10-14 DIAGNOSIS — R5383 Other fatigue: Secondary | ICD-10-CM | POA: Diagnosis not present

## 2022-10-14 DIAGNOSIS — E039 Hypothyroidism, unspecified: Secondary | ICD-10-CM | POA: Diagnosis not present

## 2022-10-19 ENCOUNTER — Institutional Professional Consult (permissible substitution): Payer: BC Managed Care – PPO | Admitting: Neurology

## 2022-10-19 ENCOUNTER — Encounter: Payer: Self-pay | Admitting: Neurology

## 2022-10-26 DIAGNOSIS — Z049 Encounter for examination and observation for unspecified reason: Secondary | ICD-10-CM | POA: Diagnosis not present

## 2022-10-26 DIAGNOSIS — Z79899 Other long term (current) drug therapy: Secondary | ICD-10-CM | POA: Diagnosis not present

## 2022-10-26 DIAGNOSIS — R519 Headache, unspecified: Secondary | ICD-10-CM | POA: Diagnosis not present

## 2022-10-26 DIAGNOSIS — G4452 New daily persistent headache (NDPH): Secondary | ICD-10-CM | POA: Diagnosis not present

## 2022-10-27 ENCOUNTER — Other Ambulatory Visit: Payer: Self-pay | Admitting: Specialist

## 2022-10-27 DIAGNOSIS — G8929 Other chronic pain: Secondary | ICD-10-CM

## 2022-10-27 DIAGNOSIS — M79642 Pain in left hand: Secondary | ICD-10-CM | POA: Diagnosis not present

## 2022-10-27 DIAGNOSIS — M79641 Pain in right hand: Secondary | ICD-10-CM | POA: Diagnosis not present

## 2022-10-27 DIAGNOSIS — D8989 Other specified disorders involving the immune mechanism, not elsewhere classified: Secondary | ICD-10-CM | POA: Diagnosis not present

## 2022-10-27 DIAGNOSIS — M0579 Rheumatoid arthritis with rheumatoid factor of multiple sites without organ or systems involvement: Secondary | ICD-10-CM | POA: Diagnosis not present

## 2022-11-03 ENCOUNTER — Ambulatory Visit: Payer: BC Managed Care – PPO | Admitting: Internal Medicine

## 2022-11-06 ENCOUNTER — Ambulatory Visit
Admission: RE | Admit: 2022-11-06 | Discharge: 2022-11-06 | Disposition: A | Discharge status: Home / Self Care | Payer: BC Managed Care – PPO | Source: Ambulatory Visit | Attending: Specialist | Admitting: Specialist

## 2022-11-06 DIAGNOSIS — G8929 Other chronic pain: Secondary | ICD-10-CM

## 2022-11-06 DIAGNOSIS — R42 Dizziness and giddiness: Secondary | ICD-10-CM | POA: Diagnosis not present

## 2022-11-06 MED ORDER — GADOPICLENOL 0.5 MMOL/ML IV SOLN
10.0000 mL | Freq: Once | INTRAVENOUS | Status: AC | PRN
  Administered 2022-11-06: 10 mL via INTRAVENOUS

## 2022-11-09 ENCOUNTER — Ambulatory Visit: Payer: BC Managed Care – PPO | Admitting: Internal Medicine

## 2022-11-10 NOTE — Progress Notes (Signed)
Cancelled.  

## 2022-11-12 DIAGNOSIS — M79641 Pain in right hand: Secondary | ICD-10-CM | POA: Diagnosis not present

## 2022-11-12 DIAGNOSIS — M79642 Pain in left hand: Secondary | ICD-10-CM | POA: Diagnosis not present

## 2022-11-12 DIAGNOSIS — M2560 Stiffness of unspecified joint, not elsewhere classified: Secondary | ICD-10-CM | POA: Diagnosis not present

## 2022-11-12 DIAGNOSIS — M0579 Rheumatoid arthritis with rheumatoid factor of multiple sites without organ or systems involvement: Secondary | ICD-10-CM | POA: Diagnosis not present

## 2022-11-13 ENCOUNTER — Ambulatory Visit: Payer: BC Managed Care – PPO | Admitting: Internal Medicine

## 2022-11-26 DIAGNOSIS — M9903 Segmental and somatic dysfunction of lumbar region: Secondary | ICD-10-CM | POA: Diagnosis not present

## 2022-11-26 DIAGNOSIS — M9901 Segmental and somatic dysfunction of cervical region: Secondary | ICD-10-CM | POA: Diagnosis not present

## 2022-11-26 DIAGNOSIS — M9905 Segmental and somatic dysfunction of pelvic region: Secondary | ICD-10-CM | POA: Diagnosis not present

## 2022-11-26 DIAGNOSIS — M9902 Segmental and somatic dysfunction of thoracic region: Secondary | ICD-10-CM | POA: Diagnosis not present

## 2022-12-21 DIAGNOSIS — E039 Hypothyroidism, unspecified: Secondary | ICD-10-CM | POA: Diagnosis not present

## 2022-12-21 DIAGNOSIS — Z1329 Encounter for screening for other suspected endocrine disorder: Secondary | ICD-10-CM | POA: Diagnosis not present

## 2022-12-21 DIAGNOSIS — R635 Abnormal weight gain: Secondary | ICD-10-CM | POA: Diagnosis not present

## 2022-12-21 DIAGNOSIS — E538 Deficiency of other specified B group vitamins: Secondary | ICD-10-CM | POA: Diagnosis not present

## 2022-12-21 DIAGNOSIS — R7303 Prediabetes: Secondary | ICD-10-CM | POA: Diagnosis not present

## 2022-12-21 DIAGNOSIS — E559 Vitamin D deficiency, unspecified: Secondary | ICD-10-CM | POA: Diagnosis not present

## 2022-12-21 DIAGNOSIS — R5383 Other fatigue: Secondary | ICD-10-CM | POA: Diagnosis not present

## 2022-12-21 DIAGNOSIS — K5900 Constipation, unspecified: Secondary | ICD-10-CM | POA: Diagnosis not present

## 2023-01-10 DIAGNOSIS — R0683 Snoring: Secondary | ICD-10-CM | POA: Diagnosis not present

## 2023-01-27 DIAGNOSIS — E039 Hypothyroidism, unspecified: Secondary | ICD-10-CM | POA: Diagnosis not present

## 2023-01-27 DIAGNOSIS — Z1329 Encounter for screening for other suspected endocrine disorder: Secondary | ICD-10-CM | POA: Diagnosis not present

## 2023-01-27 DIAGNOSIS — R5383 Other fatigue: Secondary | ICD-10-CM | POA: Diagnosis not present

## 2023-01-27 DIAGNOSIS — R7303 Prediabetes: Secondary | ICD-10-CM | POA: Diagnosis not present

## 2023-01-27 DIAGNOSIS — K5900 Constipation, unspecified: Secondary | ICD-10-CM | POA: Diagnosis not present

## 2023-02-09 DIAGNOSIS — E039 Hypothyroidism, unspecified: Secondary | ICD-10-CM | POA: Diagnosis not present

## 2023-02-09 DIAGNOSIS — J309 Allergic rhinitis, unspecified: Secondary | ICD-10-CM | POA: Diagnosis not present

## 2023-02-09 DIAGNOSIS — F909 Attention-deficit hyperactivity disorder, unspecified type: Secondary | ICD-10-CM | POA: Diagnosis not present

## 2023-02-09 DIAGNOSIS — E559 Vitamin D deficiency, unspecified: Secondary | ICD-10-CM | POA: Diagnosis not present

## 2023-02-10 DIAGNOSIS — E039 Hypothyroidism, unspecified: Secondary | ICD-10-CM | POA: Diagnosis not present

## 2023-04-15 DIAGNOSIS — E538 Deficiency of other specified B group vitamins: Secondary | ICD-10-CM | POA: Diagnosis not present

## 2023-04-15 DIAGNOSIS — E559 Vitamin D deficiency, unspecified: Secondary | ICD-10-CM | POA: Diagnosis not present

## 2023-04-15 DIAGNOSIS — Z23 Encounter for immunization: Secondary | ICD-10-CM | POA: Diagnosis not present

## 2023-04-15 DIAGNOSIS — D509 Iron deficiency anemia, unspecified: Secondary | ICD-10-CM | POA: Diagnosis not present

## 2023-04-15 DIAGNOSIS — R946 Abnormal results of thyroid function studies: Secondary | ICD-10-CM | POA: Diagnosis not present

## 2023-05-21 DIAGNOSIS — L52 Erythema nodosum: Secondary | ICD-10-CM | POA: Diagnosis not present

## 2023-05-21 DIAGNOSIS — Z87891 Personal history of nicotine dependence: Secondary | ICD-10-CM | POA: Diagnosis not present

## 2023-05-21 DIAGNOSIS — M79661 Pain in right lower leg: Secondary | ICD-10-CM | POA: Diagnosis not present

## 2023-05-21 DIAGNOSIS — M791 Myalgia, unspecified site: Secondary | ICD-10-CM | POA: Diagnosis not present

## 2023-05-21 DIAGNOSIS — R030 Elevated blood-pressure reading, without diagnosis of hypertension: Secondary | ICD-10-CM | POA: Diagnosis not present

## 2023-05-21 DIAGNOSIS — M79604 Pain in right leg: Secondary | ICD-10-CM | POA: Diagnosis not present

## 2023-06-07 DIAGNOSIS — N926 Irregular menstruation, unspecified: Secondary | ICD-10-CM | POA: Diagnosis not present

## 2023-06-07 DIAGNOSIS — S80861A Insect bite (nonvenomous), right lower leg, initial encounter: Secondary | ICD-10-CM | POA: Diagnosis not present

## 2023-06-07 DIAGNOSIS — R102 Pelvic and perineal pain: Secondary | ICD-10-CM | POA: Diagnosis not present

## 2023-06-07 DIAGNOSIS — J329 Chronic sinusitis, unspecified: Secondary | ICD-10-CM | POA: Diagnosis not present

## 2023-06-07 DIAGNOSIS — Z113 Encounter for screening for infections with a predominantly sexual mode of transmission: Secondary | ICD-10-CM | POA: Diagnosis not present

## 2023-06-07 DIAGNOSIS — E559 Vitamin D deficiency, unspecified: Secondary | ICD-10-CM | POA: Diagnosis not present

## 2023-06-07 DIAGNOSIS — E538 Deficiency of other specified B group vitamins: Secondary | ICD-10-CM | POA: Diagnosis not present

## 2023-07-25 IMAGING — CR DG FEMUR 2+V*R*
4 series · 4 of 4 positions shown · non-contrast
Comparison: Right knee radiographs 04/24/2017

CLINICAL DATA: Right hip tenderness. Medial joint line tenderness
of right knee.

EXAM:
PELVIS - 1-2 VIEW; RIGHT FEMUR 2 VIEWS

[t femur with hip  ap right]
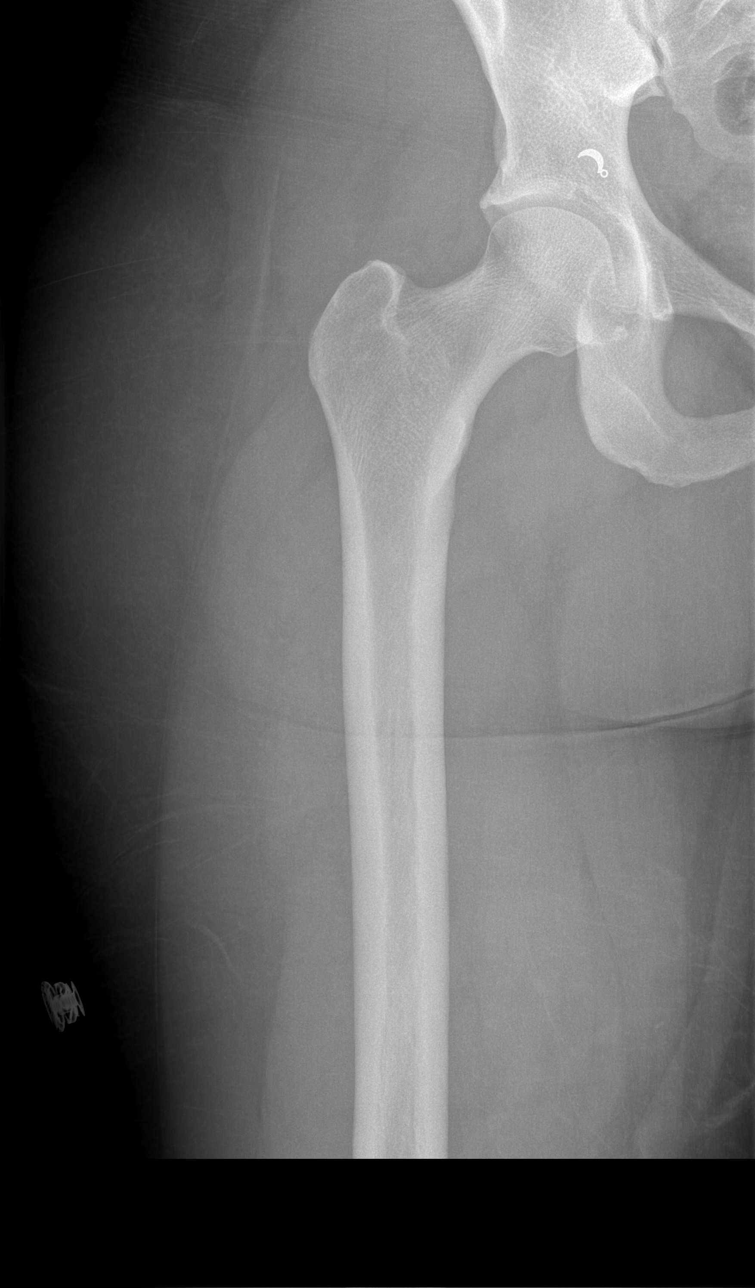

[t femur with knee ap right]
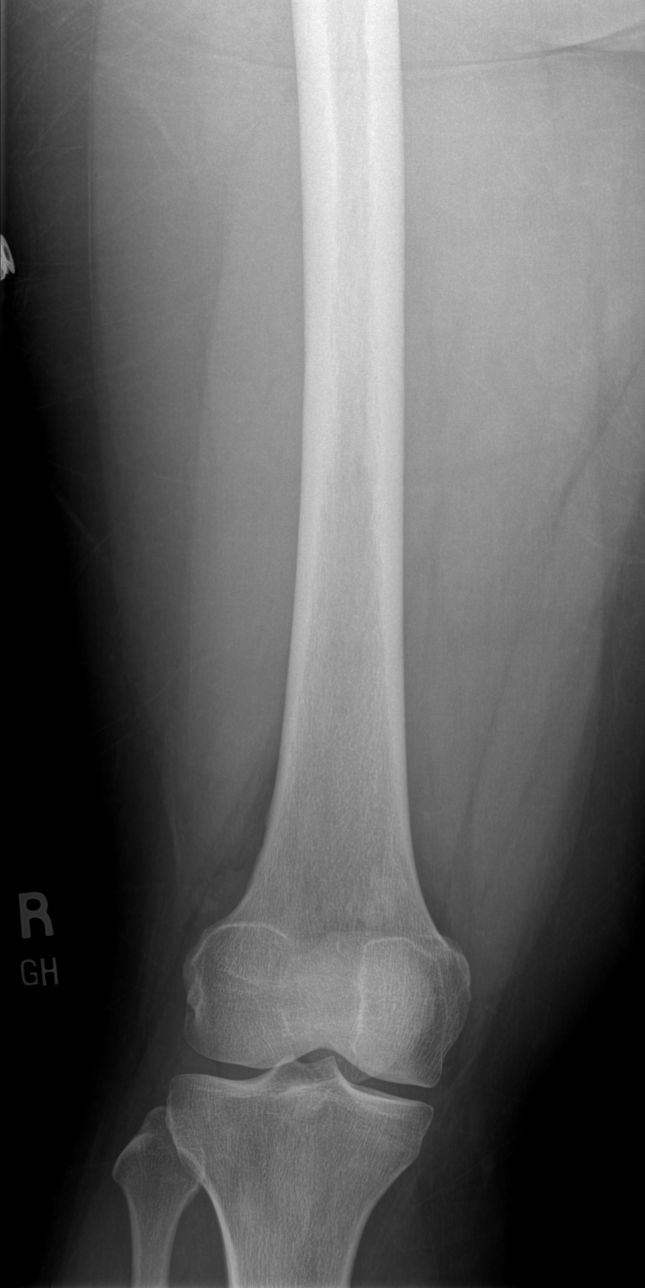

[t femur with hip lat right]
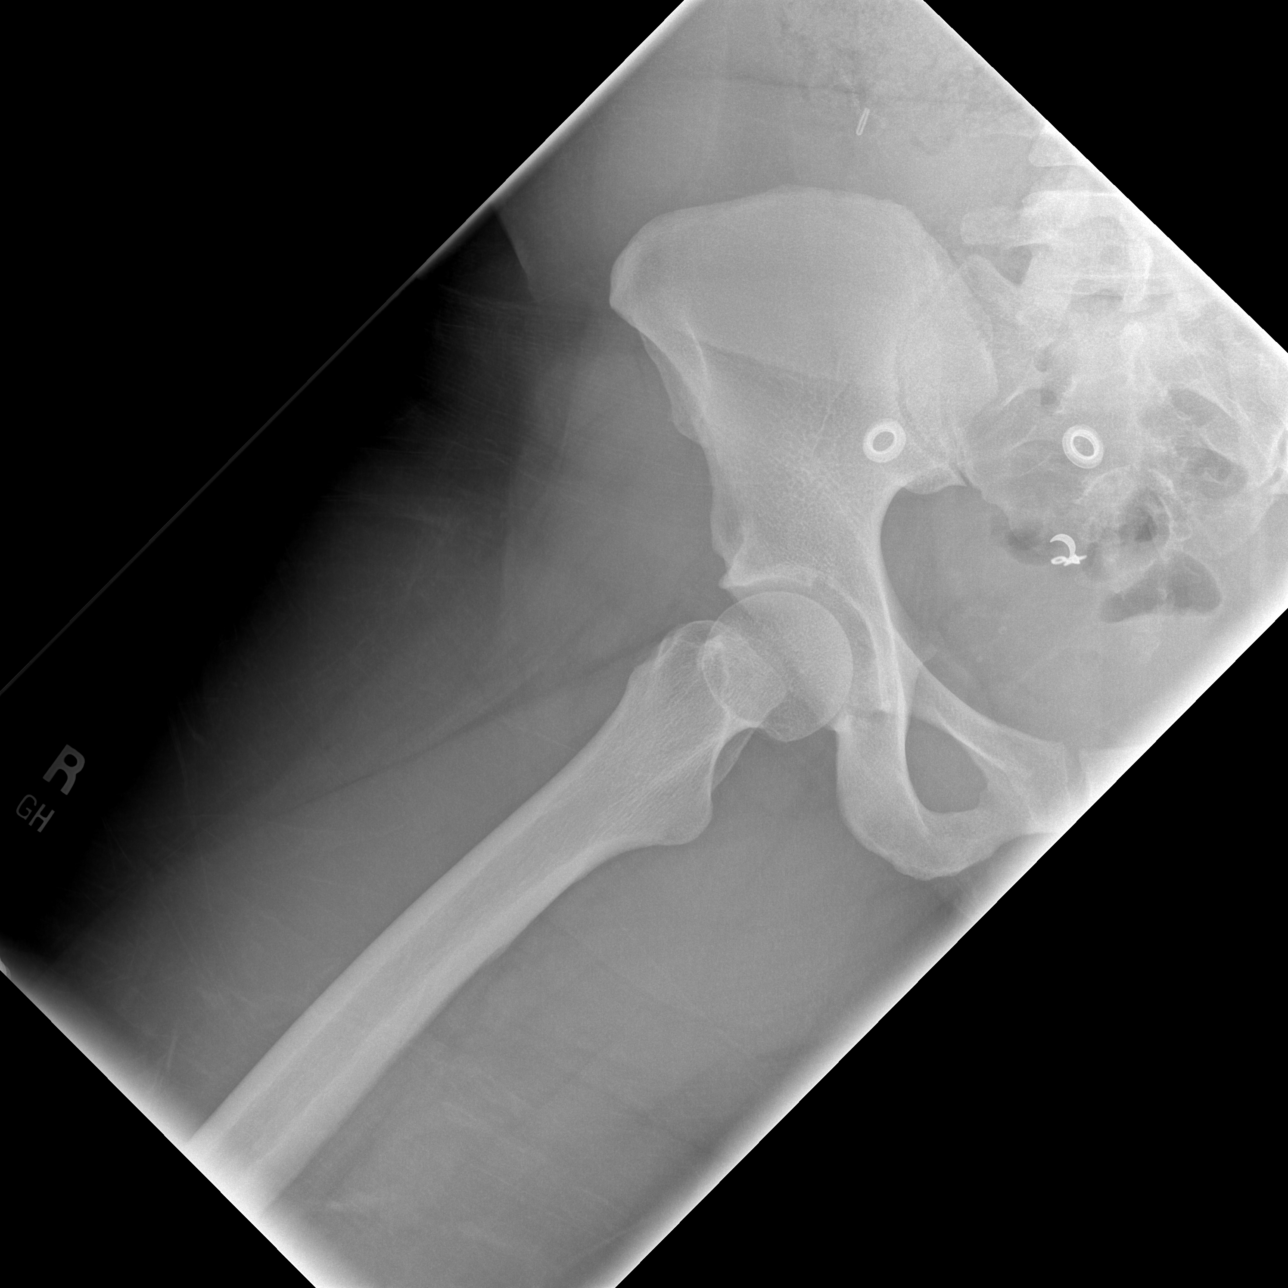

[t femur with knee lat right]
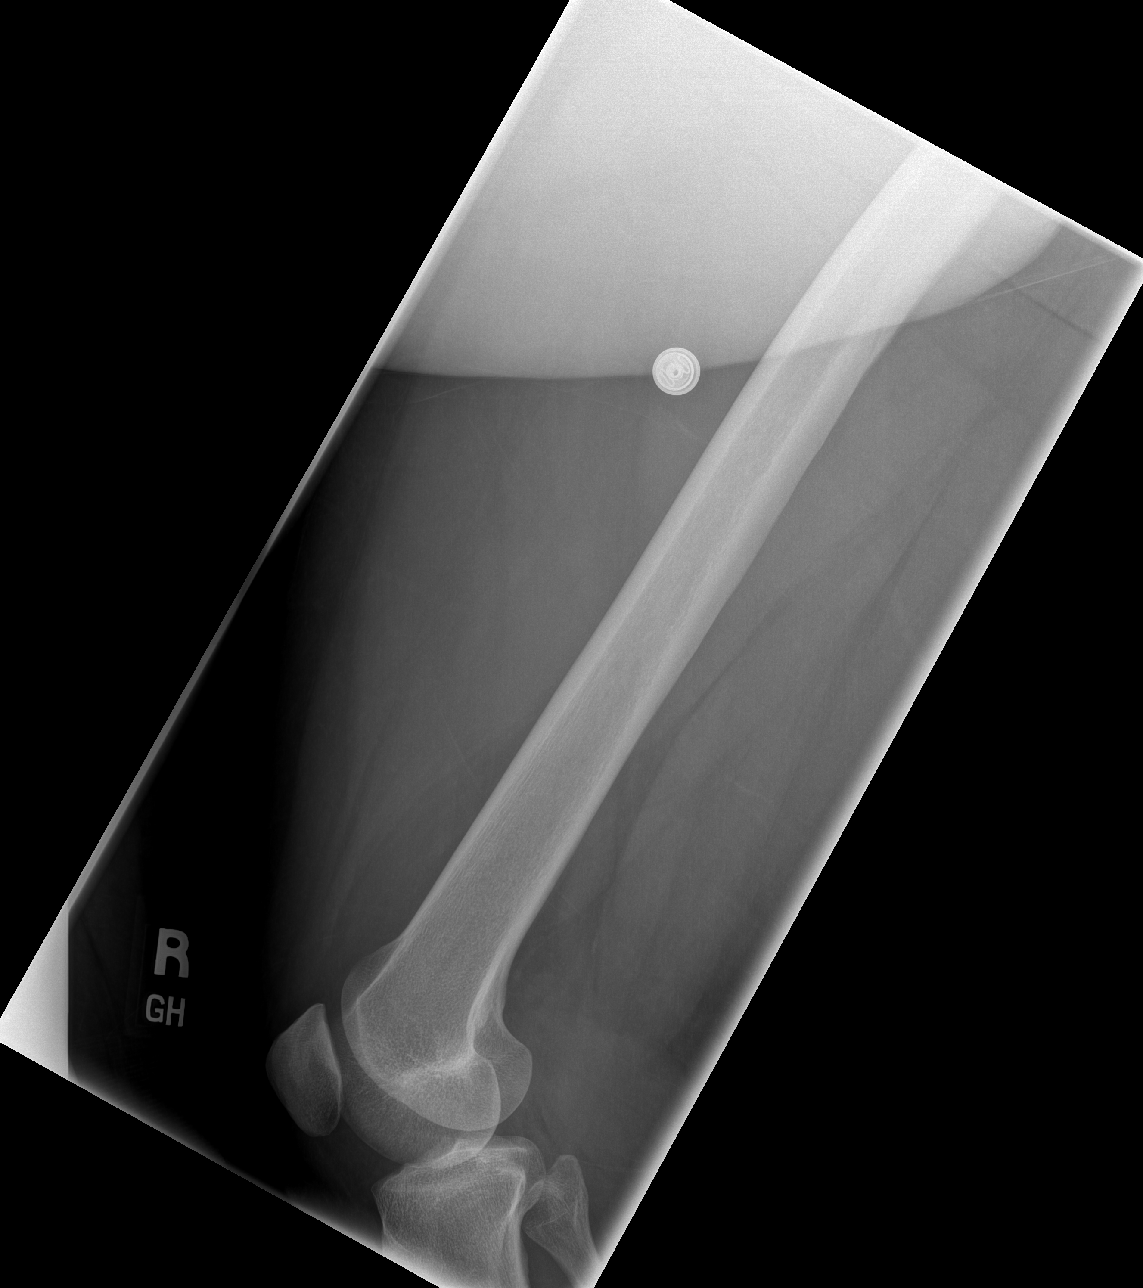

[4 of 4 positions shown; findings below may reference images not displayed]

FINDINGS: Pelvis and right hip:

Normal bone mineralization. The bilateral sacroiliac, bilateral
femoroacetabular and pubic symphysis joint spaces are maintained. No
acute fracture or dislocation.

Right femur:

Normal bone mineralization. The cortex is intact. No acute fracture
is seen. No dislocation. No knee joint effusion.
IMPRESSION: No significant degenerative change. No acute fracture within the
pelvis, right hip, or right femur.

## 2023-07-25 IMAGING — CR DG PELVIS 1-2V
1 series · 1 of 1 positions shown · non-contrast
Comparison: Right knee radiographs 04/24/2017

CLINICAL DATA: Right hip tenderness. Medial joint line tenderness
of right knee.

EXAM:
PELVIS - 1-2 VIEW; RIGHT FEMUR 2 VIEWS

[t pelvis a.p.]
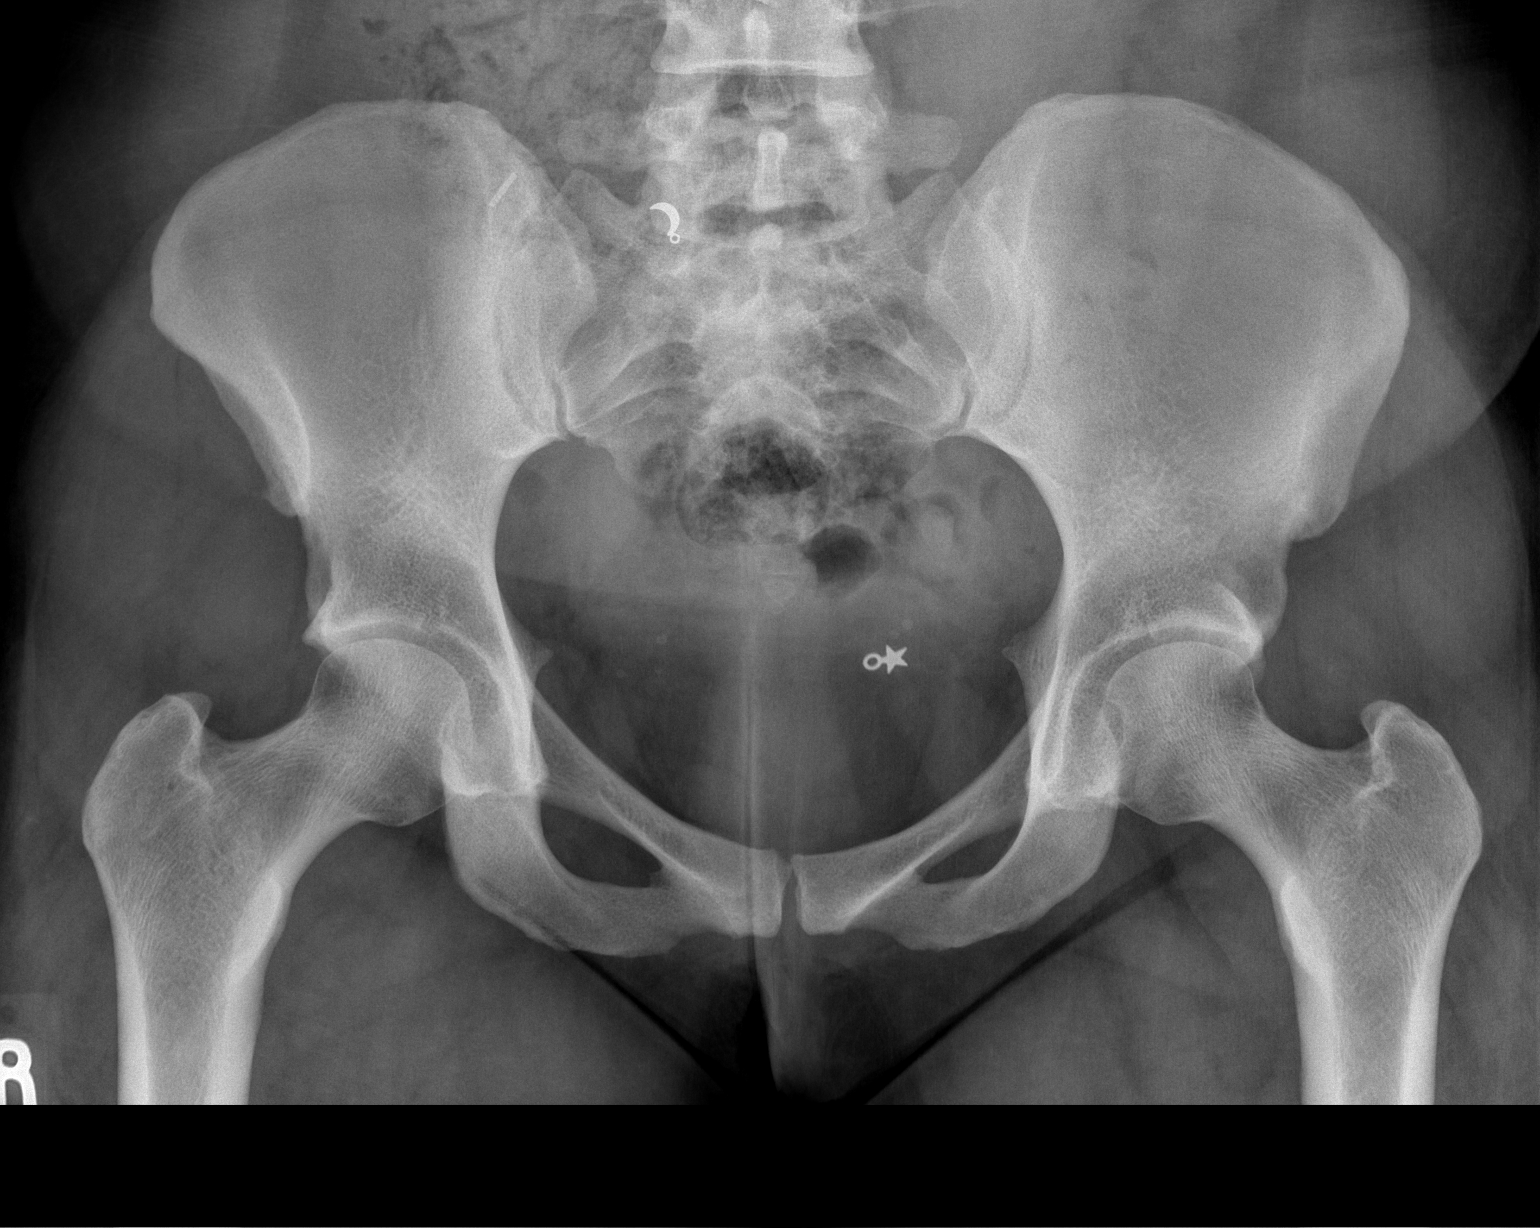

[1 of 1 positions shown; findings below may reference images not displayed]

FINDINGS: Pelvis and right hip:

Normal bone mineralization. The bilateral sacroiliac, bilateral
femoroacetabular and pubic symphysis joint spaces are maintained. No
acute fracture or dislocation.

Right femur:

Normal bone mineralization. The cortex is intact. No acute fracture
is seen. No dislocation. No knee joint effusion.
IMPRESSION: No significant degenerative change. No acute fracture within the
pelvis, right hip, or right femur.

## 2023-07-27 DIAGNOSIS — Z91040 Latex allergy status: Secondary | ICD-10-CM | POA: Diagnosis not present

## 2023-07-27 DIAGNOSIS — R42 Dizziness and giddiness: Secondary | ICD-10-CM | POA: Diagnosis not present

## 2023-07-27 DIAGNOSIS — I498 Other specified cardiac arrhythmias: Secondary | ICD-10-CM | POA: Diagnosis not present

## 2023-07-27 DIAGNOSIS — R6884 Jaw pain: Secondary | ICD-10-CM | POA: Diagnosis not present

## 2023-07-27 DIAGNOSIS — R9431 Abnormal electrocardiogram [ECG] [EKG]: Secondary | ICD-10-CM | POA: Diagnosis not present

## 2023-07-27 DIAGNOSIS — R079 Chest pain, unspecified: Secondary | ICD-10-CM | POA: Diagnosis not present

## 2023-07-27 DIAGNOSIS — R072 Precordial pain: Secondary | ICD-10-CM | POA: Diagnosis not present

## 2023-07-27 DIAGNOSIS — M542 Cervicalgia: Secondary | ICD-10-CM | POA: Diagnosis not present

## 2023-07-27 DIAGNOSIS — R519 Headache, unspecified: Secondary | ICD-10-CM | POA: Diagnosis not present

## 2023-07-27 DIAGNOSIS — Z87891 Personal history of nicotine dependence: Secondary | ICD-10-CM | POA: Diagnosis not present

## 2023-08-17 DIAGNOSIS — D509 Iron deficiency anemia, unspecified: Secondary | ICD-10-CM | POA: Diagnosis not present

## 2023-08-17 DIAGNOSIS — Z13220 Encounter for screening for lipoid disorders: Secondary | ICD-10-CM | POA: Diagnosis not present

## 2023-08-17 DIAGNOSIS — Z0000 Encounter for general adult medical examination without abnormal findings: Secondary | ICD-10-CM | POA: Diagnosis not present

## 2023-08-17 DIAGNOSIS — Z6839 Body mass index (BMI) 39.0-39.9, adult: Secondary | ICD-10-CM | POA: Diagnosis not present

## 2023-08-17 DIAGNOSIS — R7303 Prediabetes: Secondary | ICD-10-CM | POA: Diagnosis not present

## 2023-08-17 DIAGNOSIS — E559 Vitamin D deficiency, unspecified: Secondary | ICD-10-CM | POA: Diagnosis not present

## 2023-08-29 DIAGNOSIS — Z87891 Personal history of nicotine dependence: Secondary | ICD-10-CM | POA: Diagnosis not present

## 2023-08-29 DIAGNOSIS — J45909 Unspecified asthma, uncomplicated: Secondary | ICD-10-CM | POA: Diagnosis not present

## 2023-08-29 DIAGNOSIS — R197 Diarrhea, unspecified: Secondary | ICD-10-CM | POA: Diagnosis not present

## 2023-08-29 DIAGNOSIS — R112 Nausea with vomiting, unspecified: Secondary | ICD-10-CM | POA: Diagnosis not present

## 2023-08-29 DIAGNOSIS — K529 Noninfective gastroenteritis and colitis, unspecified: Secondary | ICD-10-CM | POA: Diagnosis not present

## 2023-08-29 DIAGNOSIS — Z9089 Acquired absence of other organs: Secondary | ICD-10-CM | POA: Diagnosis not present

## 2023-08-29 DIAGNOSIS — Z9049 Acquired absence of other specified parts of digestive tract: Secondary | ICD-10-CM | POA: Diagnosis not present

## 2023-08-29 DIAGNOSIS — M545 Low back pain, unspecified: Secondary | ICD-10-CM | POA: Diagnosis not present

## 2023-08-29 DIAGNOSIS — R109 Unspecified abdominal pain: Secondary | ICD-10-CM | POA: Diagnosis not present

## 2023-09-01 DIAGNOSIS — Z30017 Encounter for initial prescription of implantable subdermal contraceptive: Secondary | ICD-10-CM | POA: Diagnosis not present

## 2023-09-18 DIAGNOSIS — R059 Cough, unspecified: Secondary | ICD-10-CM | POA: Diagnosis not present

## 2023-09-18 DIAGNOSIS — R0981 Nasal congestion: Secondary | ICD-10-CM | POA: Diagnosis not present

## 2023-09-18 DIAGNOSIS — J029 Acute pharyngitis, unspecified: Secondary | ICD-10-CM | POA: Diagnosis not present

## 2023-09-18 DIAGNOSIS — R519 Headache, unspecified: Secondary | ICD-10-CM | POA: Diagnosis not present

## 2023-09-18 DIAGNOSIS — J019 Acute sinusitis, unspecified: Secondary | ICD-10-CM | POA: Diagnosis not present

## 2023-10-20 DIAGNOSIS — F411 Generalized anxiety disorder: Secondary | ICD-10-CM | POA: Diagnosis not present

## 2023-10-22 DIAGNOSIS — R197 Diarrhea, unspecified: Secondary | ICD-10-CM | POA: Diagnosis not present

## 2023-10-22 DIAGNOSIS — R1111 Vomiting without nausea: Secondary | ICD-10-CM | POA: Diagnosis not present

## 2023-10-22 DIAGNOSIS — Z5321 Procedure and treatment not carried out due to patient leaving prior to being seen by health care provider: Secondary | ICD-10-CM | POA: Diagnosis not present

## 2023-10-25 DIAGNOSIS — R112 Nausea with vomiting, unspecified: Secondary | ICD-10-CM | POA: Diagnosis not present

## 2023-10-25 DIAGNOSIS — B354 Tinea corporis: Secondary | ICD-10-CM | POA: Diagnosis not present

## 2023-10-25 DIAGNOSIS — R197 Diarrhea, unspecified: Secondary | ICD-10-CM | POA: Diagnosis not present

## 2023-10-25 DIAGNOSIS — R634 Abnormal weight loss: Secondary | ICD-10-CM | POA: Diagnosis not present

## 2023-10-27 DIAGNOSIS — Z9149 Other personal history of psychological trauma, not elsewhere classified: Secondary | ICD-10-CM | POA: Diagnosis not present

## 2023-10-27 DIAGNOSIS — F418 Other specified anxiety disorders: Secondary | ICD-10-CM | POA: Diagnosis not present

## 2023-11-12 DIAGNOSIS — F418 Other specified anxiety disorders: Secondary | ICD-10-CM | POA: Diagnosis not present

## 2023-11-12 DIAGNOSIS — Z9149 Other personal history of psychological trauma, not elsewhere classified: Secondary | ICD-10-CM | POA: Diagnosis not present

## 2023-11-13 DIAGNOSIS — S6391XA Sprain of unspecified part of right wrist and hand, initial encounter: Secondary | ICD-10-CM | POA: Diagnosis not present

## 2023-11-13 DIAGNOSIS — S63601A Unspecified sprain of right thumb, initial encounter: Secondary | ICD-10-CM | POA: Diagnosis not present

## 2023-11-16 DIAGNOSIS — J0100 Acute maxillary sinusitis, unspecified: Secondary | ICD-10-CM | POA: Diagnosis not present

## 2023-11-16 DIAGNOSIS — L309 Dermatitis, unspecified: Secondary | ICD-10-CM | POA: Diagnosis not present

## 2023-11-19 DIAGNOSIS — N644 Mastodynia: Secondary | ICD-10-CM | POA: Diagnosis not present

## 2023-11-19 DIAGNOSIS — N6321 Unspecified lump in the left breast, upper outer quadrant: Secondary | ICD-10-CM | POA: Diagnosis not present

## 2023-11-19 DIAGNOSIS — J019 Acute sinusitis, unspecified: Secondary | ICD-10-CM | POA: Diagnosis not present

## 2023-11-22 DIAGNOSIS — N644 Mastodynia: Secondary | ICD-10-CM | POA: Diagnosis not present

## 2023-11-22 DIAGNOSIS — N6325 Unspecified lump in the left breast, overlapping quadrants: Secondary | ICD-10-CM | POA: Diagnosis not present

## 2023-11-22 DIAGNOSIS — Z809 Family history of malignant neoplasm, unspecified: Secondary | ICD-10-CM | POA: Diagnosis not present

## 2023-11-26 DIAGNOSIS — N6325 Unspecified lump in the left breast, overlapping quadrants: Secondary | ICD-10-CM | POA: Diagnosis not present

## 2023-11-26 DIAGNOSIS — N644 Mastodynia: Secondary | ICD-10-CM | POA: Diagnosis not present

## 2023-11-30 DIAGNOSIS — Z9149 Other personal history of psychological trauma, not elsewhere classified: Secondary | ICD-10-CM | POA: Diagnosis not present

## 2023-11-30 DIAGNOSIS — F418 Other specified anxiety disorders: Secondary | ICD-10-CM | POA: Diagnosis not present

## 2023-12-21 DIAGNOSIS — F418 Other specified anxiety disorders: Secondary | ICD-10-CM | POA: Diagnosis not present

## 2023-12-21 DIAGNOSIS — Z9149 Other personal history of psychological trauma, not elsewhere classified: Secondary | ICD-10-CM | POA: Diagnosis not present

## 2023-12-28 DIAGNOSIS — R1030 Lower abdominal pain, unspecified: Secondary | ICD-10-CM | POA: Diagnosis not present

## 2023-12-28 DIAGNOSIS — N949 Unspecified condition associated with female genital organs and menstrual cycle: Secondary | ICD-10-CM | POA: Diagnosis not present

## 2023-12-29 DIAGNOSIS — R1030 Lower abdominal pain, unspecified: Secondary | ICD-10-CM | POA: Diagnosis not present

## 2023-12-29 DIAGNOSIS — N949 Unspecified condition associated with female genital organs and menstrual cycle: Secondary | ICD-10-CM | POA: Diagnosis not present

## 2024-01-05 DIAGNOSIS — R1084 Generalized abdominal pain: Secondary | ICD-10-CM | POA: Diagnosis not present

## 2024-01-05 DIAGNOSIS — R222 Localized swelling, mass and lump, trunk: Secondary | ICD-10-CM | POA: Diagnosis not present

## 2024-01-05 DIAGNOSIS — K429 Umbilical hernia without obstruction or gangrene: Secondary | ICD-10-CM | POA: Diagnosis not present

## 2024-01-05 DIAGNOSIS — R1032 Left lower quadrant pain: Secondary | ICD-10-CM | POA: Diagnosis not present

## 2024-01-07 DIAGNOSIS — K429 Umbilical hernia without obstruction or gangrene: Secondary | ICD-10-CM | POA: Diagnosis not present

## 2024-01-18 DIAGNOSIS — K4090 Unilateral inguinal hernia, without obstruction or gangrene, not specified as recurrent: Secondary | ICD-10-CM | POA: Diagnosis not present

## 2024-01-18 DIAGNOSIS — I10 Essential (primary) hypertension: Secondary | ICD-10-CM | POA: Diagnosis not present

## 2024-01-18 DIAGNOSIS — M069 Rheumatoid arthritis, unspecified: Secondary | ICD-10-CM | POA: Diagnosis not present

## 2024-01-18 DIAGNOSIS — K429 Umbilical hernia without obstruction or gangrene: Secondary | ICD-10-CM | POA: Diagnosis not present

## 2024-01-22 DIAGNOSIS — F4320 Adjustment disorder, unspecified: Secondary | ICD-10-CM | POA: Diagnosis not present

## 2024-02-11 DIAGNOSIS — Z860100 Personal history of colon polyps, unspecified: Secondary | ICD-10-CM | POA: Diagnosis not present

## 2024-02-11 DIAGNOSIS — I10 Essential (primary) hypertension: Secondary | ICD-10-CM | POA: Diagnosis not present

## 2024-02-11 DIAGNOSIS — K219 Gastro-esophageal reflux disease without esophagitis: Secondary | ICD-10-CM | POA: Diagnosis not present

## 2024-02-11 DIAGNOSIS — Z8711 Personal history of peptic ulcer disease: Secondary | ICD-10-CM | POA: Diagnosis not present

## 2024-02-11 DIAGNOSIS — M069 Rheumatoid arthritis, unspecified: Secondary | ICD-10-CM | POA: Diagnosis not present

## 2024-02-11 DIAGNOSIS — Z87891 Personal history of nicotine dependence: Secondary | ICD-10-CM | POA: Diagnosis not present

## 2024-02-11 DIAGNOSIS — K760 Fatty (change of) liver, not elsewhere classified: Secondary | ICD-10-CM | POA: Diagnosis not present

## 2024-02-11 DIAGNOSIS — M6208 Separation of muscle (nontraumatic), other site: Secondary | ICD-10-CM | POA: Diagnosis not present

## 2024-02-11 DIAGNOSIS — Z9049 Acquired absence of other specified parts of digestive tract: Secondary | ICD-10-CM | POA: Diagnosis not present

## 2024-02-11 DIAGNOSIS — K429 Umbilical hernia without obstruction or gangrene: Secondary | ICD-10-CM | POA: Diagnosis not present

## 2024-02-12 DIAGNOSIS — Z9049 Acquired absence of other specified parts of digestive tract: Secondary | ICD-10-CM | POA: Diagnosis not present

## 2024-02-12 DIAGNOSIS — Z860100 Personal history of colon polyps, unspecified: Secondary | ICD-10-CM | POA: Diagnosis not present

## 2024-02-12 DIAGNOSIS — K760 Fatty (change of) liver, not elsewhere classified: Secondary | ICD-10-CM | POA: Diagnosis not present

## 2024-02-12 DIAGNOSIS — M069 Rheumatoid arthritis, unspecified: Secondary | ICD-10-CM | POA: Diagnosis not present

## 2024-02-12 DIAGNOSIS — Z8711 Personal history of peptic ulcer disease: Secondary | ICD-10-CM | POA: Diagnosis not present

## 2024-02-12 DIAGNOSIS — Z87891 Personal history of nicotine dependence: Secondary | ICD-10-CM | POA: Diagnosis not present

## 2024-02-12 DIAGNOSIS — K219 Gastro-esophageal reflux disease without esophagitis: Secondary | ICD-10-CM | POA: Diagnosis not present

## 2024-02-12 DIAGNOSIS — I10 Essential (primary) hypertension: Secondary | ICD-10-CM | POA: Diagnosis not present

## 2024-02-12 DIAGNOSIS — M6208 Separation of muscle (nontraumatic), other site: Secondary | ICD-10-CM | POA: Diagnosis not present

## 2024-02-12 DIAGNOSIS — K429 Umbilical hernia without obstruction or gangrene: Secondary | ICD-10-CM | POA: Diagnosis not present

## 2024-02-13 DIAGNOSIS — Z9049 Acquired absence of other specified parts of digestive tract: Secondary | ICD-10-CM | POA: Diagnosis not present

## 2024-02-13 DIAGNOSIS — K760 Fatty (change of) liver, not elsewhere classified: Secondary | ICD-10-CM | POA: Diagnosis not present

## 2024-02-13 DIAGNOSIS — Z87891 Personal history of nicotine dependence: Secondary | ICD-10-CM | POA: Diagnosis not present

## 2024-02-13 DIAGNOSIS — M6208 Separation of muscle (nontraumatic), other site: Secondary | ICD-10-CM | POA: Diagnosis not present

## 2024-02-13 DIAGNOSIS — Z8711 Personal history of peptic ulcer disease: Secondary | ICD-10-CM | POA: Diagnosis not present

## 2024-02-13 DIAGNOSIS — K429 Umbilical hernia without obstruction or gangrene: Secondary | ICD-10-CM | POA: Diagnosis not present

## 2024-02-13 DIAGNOSIS — I10 Essential (primary) hypertension: Secondary | ICD-10-CM | POA: Diagnosis not present

## 2024-02-13 DIAGNOSIS — Z860100 Personal history of colon polyps, unspecified: Secondary | ICD-10-CM | POA: Diagnosis not present

## 2024-02-13 DIAGNOSIS — K219 Gastro-esophageal reflux disease without esophagitis: Secondary | ICD-10-CM | POA: Diagnosis not present

## 2024-02-13 DIAGNOSIS — M069 Rheumatoid arthritis, unspecified: Secondary | ICD-10-CM | POA: Diagnosis not present

## 2024-03-11 DIAGNOSIS — F4320 Adjustment disorder, unspecified: Secondary | ICD-10-CM | POA: Diagnosis not present

## 2024-03-17 ENCOUNTER — Telehealth: Payer: Self-pay | Admitting: Licensed Clinical Social Worker

## 2024-03-24 NOTE — Telephone Encounter (Signed)
 Received voicemail from Ms. Maudie requesting call back about amended genetic testing report. Attempted to reach her, left voicemail with call back number.   Dena Cary, MS, [facility] Genetic Counselor [email or web address] Phone: [phone]

## 2024-03-27 DIAGNOSIS — F4320 Adjustment disorder, unspecified: Secondary | ICD-10-CM | POA: Diagnosis not present

## 2024-03-28 DIAGNOSIS — Z8719 Personal history of other diseases of the digestive system: Secondary | ICD-10-CM | POA: Diagnosis not present

## 2024-03-28 DIAGNOSIS — Z9889 Other specified postprocedural states: Secondary | ICD-10-CM | POA: Diagnosis not present

## 2024-03-31 ENCOUNTER — Telehealth: Payer: Self-pay | Admitting: Genetic Counselor

## 2024-03-31 NOTE — Telephone Encounter (Signed)
 Ms. Breck called requesting a copy of her genetic test results and pedigree.  I sent a secure email to her with the original report, and amended report and her pedigree.  Darice Monte, MS, CGC  Licensed, Patent attorney [email or web address] phone: [phone]

## 2024-05-21 DIAGNOSIS — J069 Acute upper respiratory infection, unspecified: Secondary | ICD-10-CM | POA: Diagnosis not present

## 2024-05-21 DIAGNOSIS — Z87891 Personal history of nicotine dependence: Secondary | ICD-10-CM | POA: Diagnosis not present

## 2024-05-21 DIAGNOSIS — J3489 Other specified disorders of nose and nasal sinuses: Secondary | ICD-10-CM | POA: Diagnosis not present

## 2024-05-21 DIAGNOSIS — R059 Cough, unspecified: Secondary | ICD-10-CM | POA: Diagnosis not present

## 2024-05-21 DIAGNOSIS — R42 Dizziness and giddiness: Secondary | ICD-10-CM | POA: Diagnosis not present

## 2024-05-21 DIAGNOSIS — R0789 Other chest pain: Secondary | ICD-10-CM | POA: Diagnosis not present

## 2024-05-21 DIAGNOSIS — M79601 Pain in right arm: Secondary | ICD-10-CM | POA: Diagnosis not present

## 2024-05-21 DIAGNOSIS — R079 Chest pain, unspecified: Secondary | ICD-10-CM | POA: Diagnosis not present

## 2024-05-21 DIAGNOSIS — M7989 Other specified soft tissue disorders: Secondary | ICD-10-CM | POA: Diagnosis not present

## 2024-05-21 DIAGNOSIS — R002 Palpitations: Secondary | ICD-10-CM | POA: Diagnosis not present

## 2024-05-21 DIAGNOSIS — Z1152 Encounter for screening for COVID-19: Secondary | ICD-10-CM | POA: Diagnosis not present

## 2024-05-21 DIAGNOSIS — B9789 Other viral agents as the cause of diseases classified elsewhere: Secondary | ICD-10-CM | POA: Diagnosis not present

## 2024-07-05 DIAGNOSIS — R10A1 Flank pain, right side: Secondary | ICD-10-CM | POA: Diagnosis not present

## 2024-07-05 DIAGNOSIS — M791 Myalgia, unspecified site: Secondary | ICD-10-CM | POA: Diagnosis not present

## 2024-07-05 DIAGNOSIS — Z3202 Encounter for pregnancy test, result negative: Secondary | ICD-10-CM | POA: Diagnosis not present

## 2024-07-05 DIAGNOSIS — R1084 Generalized abdominal pain: Secondary | ICD-10-CM | POA: Diagnosis not present

## 2024-07-05 DIAGNOSIS — R319 Hematuria, unspecified: Secondary | ICD-10-CM | POA: Diagnosis not present

## 2024-07-05 DIAGNOSIS — K625 Hemorrhage of anus and rectum: Secondary | ICD-10-CM | POA: Diagnosis not present

## 2024-07-05 DIAGNOSIS — R1031 Right lower quadrant pain: Secondary | ICD-10-CM | POA: Diagnosis not present

## 2024-07-05 DIAGNOSIS — R300 Dysuria: Secondary | ICD-10-CM | POA: Diagnosis not present

## 2024-07-05 DIAGNOSIS — Z87891 Personal history of nicotine dependence: Secondary | ICD-10-CM | POA: Diagnosis not present

## 2024-07-05 DIAGNOSIS — M549 Dorsalgia, unspecified: Secondary | ICD-10-CM | POA: Diagnosis not present

## 2024-07-05 DIAGNOSIS — K5900 Constipation, unspecified: Secondary | ICD-10-CM | POA: Diagnosis not present

## 2024-07-05 DIAGNOSIS — K921 Melena: Secondary | ICD-10-CM | POA: Diagnosis not present

## 2024-07-06 DIAGNOSIS — Z03818 Encounter for observation for suspected exposure to other biological agents ruled out: Secondary | ICD-10-CM | POA: Diagnosis not present

## 2024-07-06 DIAGNOSIS — R1030 Lower abdominal pain, unspecified: Secondary | ICD-10-CM | POA: Diagnosis not present

## 2024-07-06 DIAGNOSIS — J029 Acute pharyngitis, unspecified: Secondary | ICD-10-CM | POA: Diagnosis not present

## 2024-07-06 DIAGNOSIS — Z8739 Personal history of other diseases of the musculoskeletal system and connective tissue: Secondary | ICD-10-CM | POA: Diagnosis not present

## 2024-07-06 DIAGNOSIS — K921 Melena: Secondary | ICD-10-CM | POA: Diagnosis not present

## 2024-07-07 DIAGNOSIS — R8290 Unspecified abnormal findings in urine: Secondary | ICD-10-CM | POA: Diagnosis not present

## 2024-07-07 DIAGNOSIS — M79641 Pain in right hand: Secondary | ICD-10-CM | POA: Diagnosis not present

## 2024-07-11 DIAGNOSIS — R102 Pelvic and perineal pain unspecified side: Secondary | ICD-10-CM | POA: Diagnosis not present
